# Patient Record
Sex: Male | Born: 1965 | Race: Black or African American | Hispanic: No | Marital: Married | Smoking: Never smoker
Health system: Southern US, Community
[De-identification: ages and names within clinical notes are randomized; demographics above are authoritative.]

## PROBLEM LIST (undated history)

## (undated) DIAGNOSIS — I10 Essential (primary) hypertension: Secondary | ICD-10-CM

## (undated) HISTORY — PX: HIP ARTHROPLASTY: SHX981

## (undated) HISTORY — PX: BACK SURGERY: SHX140

## (undated) HISTORY — PX: OPEN ANTERIOR SHOULDER RECONSTRUCTION: SHX2100

## (undated) HISTORY — PX: NECK SURGERY: SHX720

---

## 2019-11-12 ENCOUNTER — Ambulatory Visit (INDEPENDENT_AMBULATORY_CARE_PROVIDER_SITE_OTHER): Payer: 59 | Admitting: Orthopaedic Surgery

## 2019-11-12 ENCOUNTER — Other Ambulatory Visit: Payer: Self-pay

## 2019-11-12 ENCOUNTER — Ambulatory Visit: Payer: Self-pay

## 2019-11-12 VITALS — Ht 69.0 in | Wt 225.0 lb

## 2019-11-12 DIAGNOSIS — M25552 Pain in left hip: Secondary | ICD-10-CM | POA: Diagnosis not present

## 2019-11-12 DIAGNOSIS — M87052 Idiopathic aseptic necrosis of left femur: Secondary | ICD-10-CM | POA: Diagnosis not present

## 2019-11-12 NOTE — Progress Notes (Addendum)
Office Visit Note   Patient: Cody Carey           Date of Birth: 1966/06/03           MRN: [id number] Visit Date: 11/12/2019              Requested by: No referring provider defined for this encounter. PCP: Patient, No Pcp Per   Assessment & Plan: Visit Diagnoses:  1. Pain in left hip   2. Avascular necrosis of bone of left hip (Louise)     Plan: I went over the patient's x-rays with him in detail.  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  This is definitely osteonecrosis and not osteoarthritis..  I agree that he needs a hip replacement and this needs to be done soon given the degree of flattening of his femoral head and the pain he is having.  I described in detail and discussed anterior hip placement surgery.  I gave him a handout about this and we discussed in detail the risk and benefits of surgery.  We talked about his interoperative and postoperative course and what to expect.  All question concerns were answered and addressed.  We will work on getting this scheduled in the near future.  Of note, a WOMAC or HOOS score is not applicable in this situation given that the patient has end-stage avascular necrosis.  For AVN such as this it is more appropriate to use a Engineer, agricultural system.  Based on his radiographic assessment he is at stage VI, which is the highest stage showing the most severe deformity of a hip with avascular necrosis.  Follow-Up Instructions: Return for 2 weeks post-op.   Orders:  Orders Placed This Encounter  Procedures  . XR HIP UNILAT W OR W/O PELVIS 1V LEFT   No orders of the defined types were placed in this encounter.     Procedures: No procedures performed   Clinical Data: No additional findings.   Subjective: Chief Complaint  Patient presents with  . Left Hip - Pain  Patient is a very pleasant 54 year old gentleman who comes in for evaluation treatment of left hip pain and known  osteoarthritis of left hip.  He actually has a remote history of a right total hip arthroplasty that was done in [facility].  That surgeon has since retired.  His left hip pain is daily and it is 10 out of 10.  He hurts with pivoting activities.  It hurts in his groin on the left side.  Weightbearing activities cause severe pain in his left hip.  He has been told before that he needs hip replacement surgery.  He now saw me out to have this done on the left side through a direct anterior approach.  He denies being a diabetic.  He denies any other acute medical issues.  At this point his left hip pain has detrimentally affected his activities day living, his quality of life and his mobility.  He does wish to proceed with a left total hip arthroplasty.  He has been dealing with this pain for multiple years now.  He is treated this conservatively with hip strengthening exercises as well as anti-inflammatories and rest.  He has tried activity modification and weight loss.  He is now frustrated with the pain that he has been dealing with on a daily basis and it is getting worse.  He does walk using an assistive device.  His left  hip symptoms are causing a severe disability at this standpoint.  Weightbearing activities are incredibly painful.  HPI  Review of Systems He currently denies any headache, chest pain, shortness of breath, fever, chills, nausea, vomiting  Objective: Vital Signs: Ht 5\' 9"  (1.753 m)   Wt 225 lb (102.1 kg)   BMI 33.23 kg/m   Physical Exam He is alert and orient x3 and in no acute distress Ortho Exam His right hip that was replaced years ago has fluid and full range of motion with no pain at all.  The left hip has severe pain with any attempts of internal and external rotation.  There is no block to rotation but he has a lot of guarding with this rotating his left hip. Specialty Comments:  No specialty comments available.  Imaging: XR HIP UNILAT W OR W/O PELVIS 1V  LEFT  Result Date: 11/12/2019 A low AP pelvis and lateral left hip shows end-stage avascular necrosis of the left hip.  There is flattening of the femoral head and significant cystic changes all throughout the femoral head.  There is a right total hip arthroplasty that appears normal in a good position.    PMFS History: There are no problems to display for this patient.  No past medical history on file.  No family history on file.   Social History   Occupational History  . Not on file  Tobacco Use  . Smoking status: Not on file  Substance and Sexual Activity  . Alcohol use: Not on file  . Drug use: Not on file  . Sexual activity: Not on file

## 2019-11-18 ENCOUNTER — Other Ambulatory Visit: Payer: Self-pay | Admitting: Physician Assistant

## 2019-11-19 NOTE — Patient Instructions (Addendum)
DUE TO COVID-19 ONLY ONE VISITOR IS ALLOWED TO COME WITH YOU AND STAY IN THE WAITING ROOM ONLY DURING PRE OP AND PROCEDURE DAY OF SURGERY. THE 1 VISITOR MAY VISIT WITH YOU AFTER SURGERY IN YOUR PRIVATE ROOM DURING VISITING HOURS ONLY!  YOU NEED TO HAVE A COVID 19 TEST ON: 2/23/21_ @  3:00 pm   _, THIS TEST MUST BE DONE BEFORE SURGERY, COME  [address], [place] , [address].  [facility]) ONCE YOUR COVID TEST IS COMPLETED, PLEASE BEGIN THE QUARANTINE INSTRUCTIONS AS OUTLINED IN YOUR HANDOUT.                Rydge Texidor     Your procedure is scheduled on: 11/27/19   Report to [facility] Main  Entrance   Report to admitting at: 7:15 AM     Call this number if you have problems the morning of surgery [phone]    Remember:    BRUSH YOUR TEETH MORNING OF SURGERY AND RINSE YOUR MOUTH OUT, NO CHEWING GUM CANDY OR MINTS.     Take these medicines the morning of surgery with A SIP OF WATER: AMLODIPINE,GABAPENTIN                                 You may not have any metal on your body including hair pins and              piercings  Do not wear jewelry, lotions, powders or perfumes, deodorant             Men may shave face and neck.   Do not bring valuables to the hospital. [facility] IS NOT             RESPONSIBLE   FOR VALUABLES.  Contacts, dentures or bridgework may not be worn into surgery.  Leave suitcase in the car. After surgery it may be brought to your room.     Patients discharged the day of surgery will not be allowed to drive home. IF YOU ARE HAVING SURGERY AND GOING HOME THE SAME DAY, YOU MUST HAVE AN ADULT TO DRIVE YOU HOME AND BE WITH YOU FOR 24 HOURS. YOU MAY GO HOME BY TAXI OR UBER OR ORTHERWISE, BUT AN ADULT MUST ACCOMPANY YOU HOME AND STAY WITH YOU FOR 24 HOURS.  Name and phone number of your driver:  Special Instructions: N/A              Please read over the following fact sheets you were  given: _____________________________________________________________________             NO SOLID FOOD AFTER MIDNIGHT THE NIGHT PRIOR TO SURGERY. NOTHING BY MOUTH EXCEPT CLEAR LIQUIDS UNTIL: 6:45 AM . PLEASE FINISH ENSURE DRINK PER SURGEON ORDER  WHICH NEEDS TO BE COMPLETED AT: 6:45 AM .   CLEAR LIQUID DIET   Foods Allowed                                                                     Foods Excluded  Coffee and tea, regular and decaf  liquids that you cannot  Plain Jell-O any favor except red or purple                                           see through such as: Fruit ices (not with fruit pulp)                                     milk, soups, orange juice  Iced Popsicles                                    All solid food Carbonated beverages, regular and diet                                    Cranberry, grape and apple juices Sports drinks like Gatorade Lightly seasoned clear broth or consume(fat free) Sugar, honey syrup  Sample Menu Breakfast                                Lunch                                     Supper Cranberry juice                    Beef broth                            Chicken broth Jell-O                                     Grape juice                           Apple juice Coffee or tea                        Jell-O                                      Popsicle                                                Coffee or tea                        Coffee or tea  _____________________________________________________________________  [facility] - Preparing for Surgery Before surgery, you can play an important role.  Because skin is not sterile, your skin needs to be as free of germs as possible.  You can reduce the number of germs on your skin by washing with CHG (chlorahexidine gluconate) soap before surgery.  CHG is an antiseptic cleaner which kills  germs and bonds with the skin to continue killing germs even after  washing. Please DO NOT use if you have an allergy to CHG or antibacterial soaps.  If your skin becomes reddened/irritated stop using the CHG and inform your nurse when you arrive at Short Stay. Do not shave (including legs and underarms) for at least 48 hours prior to the first CHG shower.  You may shave your face/neck. Please follow these instructions carefully:  1.  Shower with CHG Soap the night before surgery and the  morning of Surgery.  2.  If you choose to wash your hair, wash your hair first as usual with your  normal  shampoo.  3.  After you shampoo, rinse your hair and body thoroughly to remove the  shampoo.                           4.  Use CHG as you would any other liquid soap.  You can apply chg directly  to the skin and wash                       Gently with a scrungie or clean washcloth.  5.  Apply the CHG Soap to your body ONLY FROM THE NECK DOWN.   Do not use on face/ open                           Wound or open sores. Avoid contact with eyes, ears mouth and genitals (private parts).                       Wash face,  Genitals (private parts) with your normal soap.             6.  Wash thoroughly, paying special attention to the area where your surgery  will be performed.  7.  Thoroughly rinse your body with warm water from the neck down.  8.  DO NOT shower/wash with your normal soap after using and rinsing off  the CHG Soap.                9.  Pat yourself dry with a clean towel.            10.  Wear clean pajamas.            11.  Place clean sheets on your bed the night of your first shower and do not  sleep with pets. Day of Surgery : Do not apply any lotions/deodorants the morning of surgery.  Please wear clean clothes to the hospital/surgery center.  FAILURE TO FOLLOW THESE INSTRUCTIONS MAY RESULT IN THE CANCELLATION OF YOUR SURGERY PATIENT SIGNATURE_________________________________  NURSE  SIGNATURE__________________________________  ________________________________________________________________________   Adam Phenix  An incentive spirometer is a tool that can help keep your lungs clear and active. This tool measures how well you are filling your lungs with each breath. Taking long deep breaths may help reverse or decrease the chance of developing breathing (pulmonary) problems (especially infection) following:  A long period of time when you are unable to move or be active. BEFORE THE PROCEDURE   If the spirometer includes an indicator to show your best effort, your nurse or respiratory therapist will set it to a desired goal.  If possible, sit up straight or lean slightly forward. Try not to slouch.  Hold the incentive spirometer in an  upright position. INSTRUCTIONS FOR USE  1. Sit on the edge of your bed if possible, or sit up as far as you can in bed or on a chair. 2. Hold the incentive spirometer in an upright position. 3. Breathe out normally. 4. Place the mouthpiece in your mouth and seal your lips tightly around it. 5. Breathe in slowly and as deeply as possible, raising the piston or the ball toward the top of the column. 6. Hold your breath for 3-5 seconds or for as long as possible. Allow the piston or ball to fall to the bottom of the column. 7. Remove the mouthpiece from your mouth and breathe out normally. 8. Rest for a few seconds and repeat Steps 1 through 7 at least 10 times every 1-2 hours when you are awake. Take your time and take a few normal breaths between deep breaths. 9. The spirometer may include an indicator to show your best effort. Use the indicator as a goal to work toward during each repetition. 10. After each set of 10 deep breaths, practice coughing to be sure your lungs are clear. If you have an incision (the cut made at the time of surgery), support your incision when coughing by placing a pillow or rolled up towels firmly  against it. Once you are able to get out of bed, walk around indoors and cough well. You may stop using the incentive spirometer when instructed by your caregiver.  RISKS AND COMPLICATIONS  Take your time so you do not get dizzy or light-headed.  If you are in pain, you may need to take or ask for pain medication before doing incentive spirometry. It is harder to take a deep breath if you are having pain. AFTER USE  Rest and breathe slowly and easily.  It can be helpful to keep track of a log of your progress. Your caregiver can provide you with a simple table to help with this. If you are using the spirometer at home, follow these instructions: [place] IF:   You are having difficultly using the spirometer.  You have trouble using the spirometer as often as instructed.  Your pain medication is not giving enough relief while using the spirometer.  You develop fever of 100.5 F (38.1 C) or higher. SEEK IMMEDIATE MEDICAL CARE IF:   You cough up bloody sputum that had not been present before.  You develop fever of 102 F (38.9 C) or greater.  You develop worsening pain at or near the incision site. MAKE SURE YOU:   Understand these instructions.  Will watch your condition.  Will get help right away if you are not doing well or get worse. Document Released: 01/28/2007 Document Revised: 12/10/2011 Document Reviewed: 03/31/2007 [facility] Patient Information 2014 [place], [place].   ________________________________________________________________________

## 2019-11-23 ENCOUNTER — Encounter (HOSPITAL_COMMUNITY): Payer: Self-pay

## 2019-11-23 ENCOUNTER — Other Ambulatory Visit: Payer: Self-pay

## 2019-11-23 ENCOUNTER — Encounter (HOSPITAL_COMMUNITY)
Admission: RE | Admit: 2019-11-23 | Discharge: 2019-11-23 | Disposition: A | Discharge status: Home / Self Care | Payer: 59 | Source: Ambulatory Visit | Attending: Orthopaedic Surgery | Admitting: Orthopaedic Surgery

## 2019-11-23 DIAGNOSIS — R9431 Abnormal electrocardiogram [ECG] [EKG]: Secondary | ICD-10-CM | POA: Insufficient documentation

## 2019-11-23 DIAGNOSIS — Z01812 Encounter for preprocedural laboratory examination: Secondary | ICD-10-CM | POA: Insufficient documentation

## 2019-11-23 DIAGNOSIS — Z01810 Encounter for preprocedural cardiovascular examination: Secondary | ICD-10-CM | POA: Diagnosis not present

## 2019-11-23 HISTORY — DX: Essential (primary) hypertension: I10

## 2019-11-23 LAB — BASIC METABOLIC PANEL
Anion gap: 12 (ref 5–15)
BUN: 10 mg/dL (ref 6–20)
CO2: 25 mmol/L (ref 22–32)
Calcium: 9.2 mg/dL (ref 8.9–10.3)
Chloride: 106 mmol/L (ref 98–111)
Creatinine, Ser: 0.84 mg/dL (ref 0.61–1.24)
GFR calc Af Amer: >60 mL/min (ref >60)
GFR calc non Af Amer: >60 mL/min (ref >60)
Glucose, Bld: 106 mg/dL — ABNORMAL HIGH (ref 70–99)
Potassium: 4.1 mmol/L (ref 3.5–5.1)
Sodium: 143 mmol/L (ref 135–145)

## 2019-11-23 LAB — CBC
HCT: 45.2 % (ref 39.0–52.0)
Hemoglobin: 14.4 g/dL (ref 13.0–17.0)
MCH: 28.7 pg (ref 26.0–34.0)
MCHC: 31.9 g/dL (ref 30.0–36.0)
MCV: 90.2 fL (ref 80.0–100.0)
Platelets: 331 10*3/uL (ref 150–400)
RBC: 5.01 MIL/uL (ref 4.22–5.81)
RDW: 14.4 % (ref 11.5–15.5)
WBC: 6.8 10*3/uL (ref 4.0–10.5)
nRBC: 0 % (ref 0.0–0.2)

## 2019-11-23 LAB — SURGICAL PCR SCREEN
MRSA, PCR: NEGATIVE
Staphylococcus aureus: NEGATIVE

## 2019-11-23 NOTE — Progress Notes (Signed)
PCP - Dr. Felipa Furnace A. LOV: 06/22/19 CE[facility] Cardiologist -   Chest x-ray -  EKG -  Stress Test -  ECHO -  Cardiac Cath -   Sleep Study -  CPAP -   Fasting Blood Sugar -  Checks Blood Sugar _____ times a day  Blood Thinner Instructions: Aspirin Instructions: Last Dose:  Anesthesia review: Pt. Denied that he smokes.  Patient denies shortness of breath, fever, cough and chest pain at PAT appointment   Patient verbalized understanding of instructions that were given to them at the PAT appointment. Patient was also instructed that they will need to review over the PAT instructions again at home before surgery.

## 2019-11-24 ENCOUNTER — Other Ambulatory Visit (HOSPITAL_COMMUNITY)
Admission: RE | Admit: 2019-11-24 | Discharge: 2019-11-24 | Disposition: A | Discharge status: Home / Self Care | Payer: 59 | Source: Ambulatory Visit | Attending: Orthopaedic Surgery | Admitting: Orthopaedic Surgery

## 2019-11-24 DIAGNOSIS — Z01812 Encounter for preprocedural laboratory examination: Secondary | ICD-10-CM | POA: Diagnosis present

## 2019-11-24 DIAGNOSIS — U071 COVID-19: Secondary | ICD-10-CM | POA: Insufficient documentation

## 2019-11-25 ENCOUNTER — Telehealth: Payer: Self-pay

## 2019-11-25 LAB — SARS CORONAVIRUS 2 (TAT 6-24 HRS): SARS Coronavirus 2: POSITIVE — AB

## 2019-11-25 NOTE — Progress Notes (Signed)
Notified: Message left on the Nurse triage line requested call back That patient's pre-procedure Covid test is +.  Patient can be rescheduled for procedure 10 days after + covid, if immunocompromised 20 days after + covid.  Patient will not require a repeat covid test if procedure rescheduled within next 90 days

## 2019-11-25 NOTE — Telephone Encounter (Signed)
Spoke with patient earlier this morning and rescheduled surgery 21 days out per [facility] guidelines.

## 2019-11-25 NOTE — Telephone Encounter (Signed)
Shanda Bumps Nurse from [facility] called and LVM on triage phone. States patient went for his pre-op appt yesterday and tested positive for Covid. He will need to cancel his SU scheduled for 11/27/2019. He will need to cancel for at least 10 days and will not require to retest. Please contact patient and relay information per [place].   If you need to call Shanda Bumps back her number is  [phone]

## 2019-11-27 ENCOUNTER — Other Ambulatory Visit: Payer: Self-pay

## 2019-12-10 NOTE — Patient Instructions (Addendum)
DUE TO COVID-19 ONLY ONE VISITOR IS ALLOWED TO COME WITH YOU AND STAY IN THE WAITING ROOM ONLY DURING PRE OP AND PROCEDURE DAY OF SURGERY. THE 1 VISITOR MAY VISIT WITH YOU AFTER SURGERY IN YOUR PRIVATE ROOM DURING VISITING HOURS ONLY!                 Cody Carey  12/10/2019   Your procedure is scheduled on: 12-18-19    Report to [facility] Main  Entrance    Report to Short Stay  at 5:30 AM     Call this number if you have problems the morning of surgery [phone]    Remember: NO SOLID FOOD AFTER MIDNIGHT THE NIGHT PRIOR TO SURGERY. NOTHING BY MOUTH EXCEPT CLEAR LIQUIDS UNTIL 4:15 AM . PLEASE FINISH ENSURE DRINK PER SURGEON ORDER  WHICH NEEDS TO BE COMPLETED AT 4:15 AM.   CLEAR LIQUID DIET   Foods Allowed                                                                     Foods Excluded  Coffee and tea, regular and decaf                             liquids that you cannot  Plain Jell-O any favor except red or purple                                           see through such as: Fruit ices (not with fruit pulp)                                     milk, soups, orange juice  Iced Popsicles                                    All solid food Carbonated beverages, regular and diet                                    Cranberry, grape and apple juices Sports drinks like Gatorade Lightly seasoned clear broth or consume(fat free) Sugar, honey syrup   _____________________________________________________________________     Take these medicines the morning of surgery with A SIP OF WATER: Amlodipine   BRUSH YOUR TEETH MORNING OF SURGERY AND RINSE YOUR MOUTH OUT, NO CHEWING GUM CANDY OR MINTS.                                You may not have any metal on your body including hair pins and              piercings     Do not wear jewelry, cologne, lotions, powders or deodorant  Men may shave face and neck.   Do not bring valuables to the hospital. [facility] IS NOT             RESPONSIBLE   FOR VALUABLES.  Contacts, dentures or bridgework may not be worn into surgery.  You may bring in overnight bag     Special Instructions: N/A              Please read over the following fact sheets you were given: _____________________________________________________________________             [facility] - Preparing for Surgery Before surgery, you can play an important role.  Because skin is not sterile, your skin needs to be as free of germs as possible.  You can reduce the number of germs on your skin by washing with CHG (chlorahexidine gluconate) soap before surgery.  CHG is an antiseptic cleaner which kills germs and bonds with the skin to continue killing germs even after washing. Please DO NOT use if you have an allergy to CHG or antibacterial soaps.  If your skin becomes reddened/irritated stop using the CHG and inform your nurse when you arrive at Short Stay. Do not shave (including legs and underarms) for at least 48 hours prior to the first CHG shower.  You may shave your face/neck. Please follow these instructions carefully:  1.  Shower with CHG Soap the night before surgery and the  morning of Surgery.  2.  If you choose to wash your hair, wash your hair first as usual with your  normal  shampoo.  3.  After you shampoo, rinse your hair and body thoroughly to remove the  shampoo.                           4.  Use CHG as you would any other liquid soap.  You can apply chg directly  to the skin and wash                       Gently with a scrungie or clean washcloth.  5.  Apply the CHG Soap to your body ONLY FROM THE NECK DOWN.   Do not use on face/ open                           Wound or open sores. Avoid contact with eyes, ears mouth and genitals (private parts).                       Wash face,  Genitals (private parts) with your normal soap.             6.  Wash thoroughly, paying special attention to the area where your surgery  will be  performed.  7.  Thoroughly rinse your body with warm water from the neck down.  8.  DO NOT shower/wash with your normal soap after using and rinsing off  the CHG Soap.                9.  Pat yourself dry with a clean towel.            10.  Wear clean pajamas.            11.  Place clean sheets on your bed the night of your first shower and do not  sleep with pets. Day of Surgery :  Do not apply any lotions/deodorants the morning of surgery.  Please wear clean clothes to the hospital/surgery center.  FAILURE TO FOLLOW THESE INSTRUCTIONS MAY RESULT IN THE CANCELLATION OF YOUR SURGERY PATIENT SIGNATURE_________________________________  NURSE SIGNATURE__________________________________  ________________________________________________________________________

## 2019-12-10 NOTE — Progress Notes (Signed)
PCP - Yvonne Kendall LOV 12-03-19 Cardiologist -   Chest x-ray -  EKG - 11-23-19 Stress Test -  ECHO -  Cardiac Cath -   Sleep Study -  CPAP -   Fasting Blood Sugar -  Checks Blood Sugar _____ times a day  Blood Thinner Instructions: Aspirin Instructions: Last Dose:  Anesthesia review:   Patient denies shortness of breath, fever, cough and chest pain at PAT appointment   Patient verbalized understanding of instructions that were given to them at the PAT appointment. Patient was also instructed that they will need to review over the PAT instructions again at home before surgery.

## 2019-12-11 ENCOUNTER — Encounter (HOSPITAL_COMMUNITY)
Admission: RE | Admit: 2019-12-11 | Discharge: 2019-12-11 | Disposition: A | Discharge status: Home / Self Care | Payer: 59 | Source: Ambulatory Visit | Attending: Orthopaedic Surgery | Admitting: Orthopaedic Surgery

## 2019-12-11 ENCOUNTER — Encounter (HOSPITAL_COMMUNITY): Payer: Self-pay

## 2019-12-11 ENCOUNTER — Other Ambulatory Visit: Payer: Self-pay

## 2019-12-11 DIAGNOSIS — Z01812 Encounter for preprocedural laboratory examination: Secondary | ICD-10-CM | POA: Insufficient documentation

## 2019-12-11 LAB — SURGICAL PCR SCREEN
MRSA, PCR: NEGATIVE
Staphylococcus aureus: NEGATIVE

## 2019-12-11 LAB — CBC
HCT: 42.9 % (ref 39.0–52.0)
Hemoglobin: 13.8 g/dL (ref 13.0–17.0)
MCH: 29.2 pg (ref 26.0–34.0)
MCHC: 32.2 g/dL (ref 30.0–36.0)
MCV: 90.9 fL (ref 80.0–100.0)
Platelets: 332 10*3/uL (ref 150–400)
RBC: 4.72 MIL/uL (ref 4.22–5.81)
RDW: 14.7 % (ref 11.5–15.5)
WBC: 7.9 10*3/uL (ref 4.0–10.5)
nRBC: 0 % (ref 0.0–0.2)

## 2019-12-11 LAB — BASIC METABOLIC PANEL
Anion gap: 13 (ref 5–15)
BUN: 15 mg/dL (ref 6–20)
CO2: 23 mmol/L (ref 22–32)
Calcium: 9.2 mg/dL (ref 8.9–10.3)
Chloride: 104 mmol/L (ref 98–111)
Creatinine, Ser: 0.98 mg/dL (ref 0.61–1.24)
GFR calc Af Amer: >60 mL/min (ref >60)
GFR calc non Af Amer: >60 mL/min (ref >60)
Glucose, Bld: 116 mg/dL — ABNORMAL HIGH (ref 70–99)
Potassium: 4 mmol/L (ref 3.5–5.1)
Sodium: 140 mmol/L (ref 135–145)

## 2019-12-14 ENCOUNTER — Inpatient Hospital Stay: Payer: 59 | Admitting: Orthopaedic Surgery

## 2019-12-17 ENCOUNTER — Encounter (HOSPITAL_COMMUNITY): Payer: Self-pay | Admitting: Orthopaedic Surgery

## 2019-12-17 DIAGNOSIS — M87052 Idiopathic aseptic necrosis of left femur: Secondary | ICD-10-CM

## 2019-12-17 HISTORY — DX: Idiopathic aseptic necrosis of left femur: M87.052

## 2019-12-17 NOTE — Anesthesia Preprocedure Evaluation (Addendum)
Anesthesia Evaluation  Patient identified by MRN, date of birth, ID band Patient awake    Reviewed: Allergy & Precautions, NPO status , Patient's Chart, lab work & pertinent test results  History of Anesthesia Complications Negative for: history of anesthetic complications  Airway Mallampati: II  TM Distance: >3 FB Neck ROM: Full    Dental   Pulmonary neg pulmonary ROS, Patient abstained from smoking.,    Pulmonary exam normal        Cardiovascular hypertension, Normal cardiovascular exam     Neuro/Psych negative neurological ROS  negative psych ROS   GI/Hepatic negative GI ROS, Neg liver ROS,   Endo/Other  negative endocrine ROS  Renal/GU negative Renal ROS  negative genitourinary   Musculoskeletal negative musculoskeletal ROS (+)   Abdominal   Peds  Hematology  (+) HIV,   Anesthesia Other Findings COVID-19 positive 11/24/19  Reproductive/Obstetrics                            Anesthesia Physical Anesthesia Plan  ASA: III  Anesthesia Plan: General   Post-op Pain Management:    Induction: Intravenous  PONV Risk Score and Plan: 2 and Ondansetron, Dexamethasone, Treatment may vary due to age or medical condition and Midazolam  Airway Management Planned: Oral ETT  Additional Equipment: None  Intra-op Plan:   Post-operative Plan: Extubation in OR  Informed Consent: I have reviewed the patients History and Physical, chart, labs and discussed the procedure including the risks, benefits and alternatives for the proposed anesthesia with the patient or authorized representative who has indicated his/her understanding and acceptance.     Dental advisory given  Plan Discussed with:   Anesthesia Plan Comments:         Anesthesia Quick Evaluation

## 2019-12-17 NOTE — H&P (Signed)
TOTAL HIP ADMISSION H&P  Patient is admitted for left total hip arthroplasty.  Subjective:  Chief Complaint: left hip pain  HPI: Cody Carey, 54 y.o. male, has a history of pain and functional disability in the left hip(s) due to avascular necrosis and patient has failed non-surgical conservative treatments for greater than 12 weeks to include NSAID's and/or analgesics, use of assistive devices and activity modification.  Onset of symptoms was abrupt starting 2 years ago with rapidlly worsening course since that time.The patient noted no past surgery on the left hip(s).  Patient currently rates pain in the left hip at 10 out of 10 with activity. Patient has night pain, worsening of pain with activity and weight bearing, trendelenberg gait, pain that interfers with activities of daily living and pain with passive range of motion. Patient has evidence of subchondral cysts and joint space narrowing by imaging studies. This condition presents safety issues increasing the risk of falls.  There is no current active infection.  Patient Active Problem List   Diagnosis Date Noted  . Avascular necrosis of hip, left (HCC) 12/17/2019   Past Medical History:  Diagnosis Date  . Avascular necrosis of hip, left (HCC) 12/17/2019  . Hypertension     Past Surgical History:  Procedure Laterality Date  . BACK SURGERY     Shave bone off, crushing into spine  . HIP ARTHROPLASTY Right   . NECK SURGERY    . OPEN ANTERIOR SHOULDER RECONSTRUCTION Bilateral     No current facility-administered medications for this encounter.   Current Outpatient Medications  Medication Sig Dispense Refill Last Dose  . amLODipine (NORVASC) 10 MG tablet Take 10 mg by mouth at bedtime.      Marland Kitchen BIKTARVY 50-200-25 MG TABS tablet Take 1 tablet by mouth at bedtime.      . diphenhydrAMINE (BENADRYL) 25 mg capsule Take 25 mg by mouth every 4 (four) hours as needed for allergies or sleep.      . diphenhydramine-acetaminophen (TYLENOL PM)  25-500 MG TABS tablet Take 1 tablet by mouth at bedtime as needed (sleep).     Marland Kitchen esomeprazole (NEXIUM) 40 MG capsule Take 40 mg by mouth at bedtime.      . gabapentin (NEURONTIN) 300 MG capsule Take 600 mg by mouth every evening.      Marland Kitchen tiZANidine (ZANAFLEX) 4 MG tablet Take 4 mg by mouth at bedtime.       Allergies  Allergen Reactions  . Lisinopril Other (See Comments) and Swelling    Pt reports affected his kidneys; made kidneys start shutting down Renal failure Pt reports affected his kidneys; made kidneys start shutting down Renal failure   . Other Rash and Swelling    Swollen Face-eyes,lips   . Shellfish-Derived Products Rash and Swelling    Swollen Face-eyes,lips    Social History   Tobacco Use  . Smoking status: Never Smoker  . Smokeless tobacco: Never Used  Substance Use Topics  . Alcohol use: Never    No family history on file.   Review of Systems  All other systems reviewed and are negative.   Objective:  Physical Exam  Constitutional: He is oriented to person, place, and time. He appears well-developed and well-nourished.  HENT:  Head: Normocephalic and atraumatic.  Eyes: Pupils are equal, round, and reactive to light. EOM are normal.  Cardiovascular: Normal rate and regular rhythm.  Respiratory: Effort normal and breath sounds normal.  GI: Soft. Bowel sounds are normal.  Musculoskeletal:  Cervical back: Normal range of motion and neck supple.     Left hip: Tenderness and bony tenderness present. Decreased range of motion. Decreased strength.  Neurological: He is alert and oriented to person, place, and time.  Skin: Skin is warm and dry.  Psychiatric: He has a normal mood and affect.    Vital signs in last 24 hours:    Labs:   Estimated body mass index is 34.85 kg/m as calculated from the following:   Height as of 12/11/19: 5\' 9"  (1.753 m).   Weight as of 12/11/19: 107 kg.   Imaging Review Plain radiographs demonstrate severe avascular  necrosis of the left hip(s). The bone quality appears to be good for age and reported activity level.      Assessment/Plan:  End stage avascular necrosis, left hip(s)  The patient history, physical examination, clinical judgement of the provider and imaging studies are consistent with end stage AVN of the left hip(s) and total hip arthroplasty is deemed medically necessary. The treatment options including medical management, injection therapy, arthroscopy and arthroplasty were discussed at length. The risks and benefits of total hip arthroplasty were presented and reviewed. The risks due to aseptic loosening, infection, stiffness, dislocation/subluxation,  thromboembolic complications and other imponderables were discussed.  The patient acknowledged the explanation, agreed to proceed with the plan and consent was signed. Patient is being admitted for inpatient treatment for surgery, pain control, PT, OT, prophylactic antibiotics, VTE prophylaxis, progressive ambulation and ADL's and discharge planning.The patient is planning to be discharged home with home health services

## 2019-12-18 ENCOUNTER — Ambulatory Visit (HOSPITAL_COMMUNITY): Payer: 59 | Admitting: Physician Assistant

## 2019-12-18 ENCOUNTER — Ambulatory Visit (HOSPITAL_COMMUNITY): Payer: 59

## 2019-12-18 ENCOUNTER — Observation Stay (HOSPITAL_COMMUNITY)
Admission: RE | Admit: 2019-12-18 | Discharge: 2019-12-19 | Disposition: A | Discharge status: Home Health | Payer: 59 | Attending: Orthopaedic Surgery | Admitting: Orthopaedic Surgery

## 2019-12-18 ENCOUNTER — Ambulatory Visit (HOSPITAL_COMMUNITY): Payer: 59 | Admitting: Certified Registered Nurse Anesthetist

## 2019-12-18 ENCOUNTER — Encounter (HOSPITAL_COMMUNITY): Payer: Self-pay | Admitting: Orthopaedic Surgery

## 2019-12-18 ENCOUNTER — Encounter (HOSPITAL_COMMUNITY)
Admission: RE | Disposition: A | Discharge status: Home Health | Payer: Self-pay | Source: Home / Self Care | Attending: Orthopaedic Surgery

## 2019-12-18 ENCOUNTER — Observation Stay (HOSPITAL_COMMUNITY): Payer: 59

## 2019-12-18 ENCOUNTER — Other Ambulatory Visit: Payer: Self-pay

## 2019-12-18 DIAGNOSIS — M87052 Idiopathic aseptic necrosis of left femur: Secondary | ICD-10-CM | POA: Diagnosis not present

## 2019-12-18 DIAGNOSIS — M87852 Other osteonecrosis, left femur: Principal | ICD-10-CM | POA: Insufficient documentation

## 2019-12-18 DIAGNOSIS — Z888 Allergy status to other drugs, medicaments and biological substances status: Secondary | ICD-10-CM | POA: Diagnosis not present

## 2019-12-18 DIAGNOSIS — Z96642 Presence of left artificial hip joint: Secondary | ICD-10-CM

## 2019-12-18 DIAGNOSIS — I10 Essential (primary) hypertension: Secondary | ICD-10-CM | POA: Insufficient documentation

## 2019-12-18 DIAGNOSIS — Z79899 Other long term (current) drug therapy: Secondary | ICD-10-CM | POA: Insufficient documentation

## 2019-12-18 DIAGNOSIS — Z419 Encounter for procedure for purposes other than remedying health state, unspecified: Secondary | ICD-10-CM

## 2019-12-18 HISTORY — PX: TOTAL HIP ARTHROPLASTY: SHX124

## 2019-12-18 SURGERY — ARTHROPLASTY, HIP, TOTAL, ANTERIOR APPROACH
Anesthesia: General | Site: Hip | Laterality: Left

## 2019-12-18 MED ORDER — PROPOFOL 1000 MG/100ML IV EMUL
INTRAVENOUS | Status: AC
  Filled 2019-12-18: qty 100

## 2019-12-18 MED ORDER — SODIUM CHLORIDE 0.9 % IV SOLN: INTRAVENOUS | Status: DC

## 2019-12-18 MED ORDER — METOCLOPRAMIDE HCL 5 MG PO TABS: 5.0000 mg | ORAL_TABLET | Freq: Three times a day (TID) | ORAL | Status: DC | PRN

## 2019-12-18 MED ORDER — MENTHOL 3 MG MT LOZG: 1.0000 | LOZENGE | OROMUCOSAL | Status: DC | PRN

## 2019-12-18 MED ORDER — FENTANYL CITRATE (PF) 100 MCG/2ML IJ SOLN
INTRAMUSCULAR | Status: DC | PRN
  Administered 2019-12-18: 100 ug via INTRAVENOUS
  Administered 2019-12-18: 50 ug via INTRAVENOUS
  Administered 2019-12-18: 100 ug via INTRAVENOUS
  Administered 2019-12-18 (×2): 50 ug via INTRAVENOUS

## 2019-12-18 MED ORDER — TRANEXAMIC ACID-NACL 1000-0.7 MG/100ML-% IV SOLN
INTRAVENOUS | Status: AC
  Filled 2019-12-18: qty 100

## 2019-12-18 MED ORDER — HYDROMORPHONE HCL 1 MG/ML IJ SOLN
0.5000 mg | INTRAMUSCULAR | Status: DC | PRN
  Administered 2019-12-18 – 2019-12-19 (×2): 1 mg via INTRAVENOUS
  Filled 2019-12-18 (×2): qty 1

## 2019-12-18 MED ORDER — ESOMEPRAZOLE MAGNESIUM 20 MG PO CPDR
20.0000 mg | DELAYED_RELEASE_CAPSULE | Freq: Every day | ORAL | Status: DC
  Administered 2019-12-18: 20 mg via ORAL
  Filled 2019-12-18 (×2): qty 1

## 2019-12-18 MED ORDER — LABETALOL HCL 5 MG/ML IV SOLN
INTRAVENOUS | Status: AC
  Filled 2019-12-18: qty 4

## 2019-12-18 MED ORDER — AMLODIPINE BESYLATE 10 MG PO TABS: 10.0000 mg | ORAL_TABLET | Freq: Every day | ORAL | Status: DC

## 2019-12-18 MED ORDER — ONDANSETRON HCL 4 MG/2ML IJ SOLN: 4.0000 mg | Freq: Four times a day (QID) | INTRAMUSCULAR | Status: DC | PRN

## 2019-12-18 MED ORDER — MIDAZOLAM HCL 5 MG/5ML IJ SOLN
INTRAMUSCULAR | Status: DC | PRN
  Administered 2019-12-18: 2 mg via INTRAVENOUS

## 2019-12-18 MED ORDER — PHENOL 1.4 % MT LIQD: 1.0000 | OROMUCOSAL | Status: DC | PRN

## 2019-12-18 MED ORDER — FENTANYL CITRATE (PF) 250 MCG/5ML IJ SOLN
INTRAMUSCULAR | Status: AC
  Filled 2019-12-18: qty 5

## 2019-12-18 MED ORDER — ALUM & MAG HYDROXIDE-SIMETH 200-200-20 MG/5ML PO SUSP
30.0000 mL | ORAL | Status: DC | PRN
  Administered 2019-12-18 (×2): 30 mL via ORAL
  Filled 2019-12-18 (×2): qty 30

## 2019-12-18 MED ORDER — CHLORHEXIDINE GLUCONATE 4 % EX LIQD: 60.0000 mL | Freq: Once | CUTANEOUS | Status: DC

## 2019-12-18 MED ORDER — DIPHENHYDRAMINE HCL 12.5 MG/5ML PO ELIX
12.5000 mg | ORAL_SOLUTION | ORAL | Status: DC | PRN
  Administered 2019-12-18: 25 mg via ORAL
  Filled 2019-12-18: qty 10

## 2019-12-18 MED ORDER — TRANEXAMIC ACID-NACL 1000-0.7 MG/100ML-% IV SOLN
1000.0000 mg | INTRAVENOUS | Status: AC
  Administered 2019-12-18: 07:00:00 1000 mg via INTRAVENOUS

## 2019-12-18 MED ORDER — OXYCODONE HCL 5 MG PO TABS
ORAL_TABLET | ORAL | Status: AC
  Filled 2019-12-18: qty 1

## 2019-12-18 MED ORDER — PROPOFOL 10 MG/ML IV BOLUS
INTRAVENOUS | Status: DC | PRN
  Administered 2019-12-18: 150 mg via INTRAVENOUS

## 2019-12-18 MED ORDER — HYDROMORPHONE HCL 1 MG/ML IJ SOLN
0.2500 mg | INTRAMUSCULAR | Status: DC | PRN
  Administered 2019-12-18: 0.25 mg via INTRAVENOUS
  Administered 2019-12-18: 0.5 mg via INTRAVENOUS
  Administered 2019-12-18: 0.25 mg via INTRAVENOUS
  Administered 2019-12-18: 0.5 mg via INTRAVENOUS

## 2019-12-18 MED ORDER — SODIUM CHLORIDE 0.9 % IR SOLN
Status: DC | PRN
  Administered 2019-12-18: 1000 mL

## 2019-12-18 MED ORDER — PANTOPRAZOLE SODIUM 40 MG PO TBEC
40.0000 mg | DELAYED_RELEASE_TABLET | Freq: Every day | ORAL | Status: DC
  Administered 2019-12-18: 40 mg via ORAL
  Filled 2019-12-18: qty 1

## 2019-12-18 MED ORDER — HYDROMORPHONE HCL 1 MG/ML IJ SOLN
INTRAMUSCULAR | Status: AC
  Administered 2019-12-18: 0.5 mg via INTRAVENOUS
  Filled 2019-12-18: qty 2

## 2019-12-18 MED ORDER — METHOCARBAMOL 500 MG PO TABS
500.0000 mg | ORAL_TABLET | Freq: Four times a day (QID) | ORAL | Status: DC | PRN
  Administered 2019-12-18 – 2019-12-19 (×3): 500 mg via ORAL
  Filled 2019-12-18 (×3): qty 1

## 2019-12-18 MED ORDER — PROPOFOL 10 MG/ML IV BOLUS
INTRAVENOUS | Status: AC
  Filled 2019-12-18: qty 20

## 2019-12-18 MED ORDER — 0.9 % SODIUM CHLORIDE (POUR BTL) OPTIME
TOPICAL | Status: DC | PRN
  Administered 2019-12-18: 08:00:00 1000 mL

## 2019-12-18 MED ORDER — LABETALOL HCL 5 MG/ML IV SOLN
10.0000 mg | INTRAVENOUS | Status: DC | PRN
  Administered 2019-12-18: 10 mg via INTRAVENOUS

## 2019-12-18 MED ORDER — HYDROMORPHONE HCL 2 MG/ML IJ SOLN
INTRAMUSCULAR | Status: AC
  Filled 2019-12-18: qty 1

## 2019-12-18 MED ORDER — KETAMINE HCL 10 MG/ML IJ SOLN
INTRAMUSCULAR | Status: DC | PRN
  Administered 2019-12-18: 40 mg via INTRAVENOUS

## 2019-12-18 MED ORDER — PHENYLEPHRINE HCL (PRESSORS) 10 MG/ML IV SOLN
INTRAVENOUS | Status: AC
  Filled 2019-12-18: qty 1

## 2019-12-18 MED ORDER — ASPIRIN 81 MG PO CHEW
81.0000 mg | CHEWABLE_TABLET | Freq: Two times a day (BID) | ORAL | Status: DC
  Administered 2019-12-18 – 2019-12-19 (×2): 81 mg via ORAL
  Filled 2019-12-18 (×2): qty 1

## 2019-12-18 MED ORDER — ONDANSETRON HCL 4 MG/2ML IJ SOLN
INTRAMUSCULAR | Status: AC
  Filled 2019-12-18: qty 2

## 2019-12-18 MED ORDER — ONDANSETRON HCL 4 MG/2ML IJ SOLN
INTRAMUSCULAR | Status: DC | PRN
  Administered 2019-12-18: 4 mg via INTRAVENOUS

## 2019-12-18 MED ORDER — ACETAMINOPHEN 325 MG PO TABS
325.0000 mg | ORAL_TABLET | Freq: Four times a day (QID) | ORAL | Status: DC | PRN
  Administered 2019-12-19: 650 mg via ORAL
  Filled 2019-12-18: qty 2

## 2019-12-18 MED ORDER — ROCURONIUM BROMIDE 10 MG/ML (PF) SYRINGE
PREFILLED_SYRINGE | INTRAVENOUS | Status: DC | PRN
  Administered 2019-12-18: 100 mg via INTRAVENOUS

## 2019-12-18 MED ORDER — CEFAZOLIN SODIUM-DEXTROSE 2-4 GM/100ML-% IV SOLN
2.0000 g | INTRAVENOUS | Status: AC
  Administered 2019-12-18: 2 g via INTRAVENOUS

## 2019-12-18 MED ORDER — OXYCODONE HCL 5 MG PO TABS
10.0000 mg | ORAL_TABLET | ORAL | Status: DC | PRN
  Administered 2019-12-18 – 2019-12-19 (×2): 15 mg via ORAL
  Administered 2019-12-19: 10 mg via ORAL
  Administered 2019-12-19: 15 mg via ORAL
  Filled 2019-12-18 (×2): qty 3
  Filled 2019-12-18 (×2): qty 2
  Filled 2019-12-18 (×2): qty 3

## 2019-12-18 MED ORDER — ONDANSETRON HCL 4 MG PO TABS: 4.0000 mg | ORAL_TABLET | Freq: Four times a day (QID) | ORAL | Status: DC | PRN

## 2019-12-18 MED ORDER — KETAMINE HCL 10 MG/ML IJ SOLN
INTRAMUSCULAR | Status: AC
  Filled 2019-12-18: qty 1

## 2019-12-18 MED ORDER — ONDANSETRON HCL 4 MG/2ML IJ SOLN: 4.0000 mg | Freq: Once | INTRAMUSCULAR | Status: DC | PRN

## 2019-12-18 MED ORDER — SUGAMMADEX SODIUM 500 MG/5ML IV SOLN
INTRAVENOUS | Status: AC
  Filled 2019-12-18: qty 5

## 2019-12-18 MED ORDER — STERILE WATER FOR IRRIGATION IR SOLN
Status: DC | PRN
  Administered 2019-12-18: 2000 mL

## 2019-12-18 MED ORDER — CEFAZOLIN SODIUM-DEXTROSE 1-4 GM/50ML-% IV SOLN
1.0000 g | Freq: Four times a day (QID) | INTRAVENOUS | Status: AC
  Administered 2019-12-18 (×2): 1 g via INTRAVENOUS
  Filled 2019-12-18 (×2): qty 50

## 2019-12-18 MED ORDER — METHOCARBAMOL 500 MG IVPB - SIMPLE MED
500.0000 mg | Freq: Four times a day (QID) | INTRAVENOUS | Status: DC | PRN
  Administered 2019-12-18: 500 mg via INTRAVENOUS
  Filled 2019-12-18: qty 50

## 2019-12-18 MED ORDER — LACTATED RINGERS IV SOLN: INTRAVENOUS | Status: DC

## 2019-12-18 MED ORDER — LIDOCAINE 2% (20 MG/ML) 5 ML SYRINGE
INTRAMUSCULAR | Status: DC | PRN
  Administered 2019-12-18: 100 mg via INTRAVENOUS

## 2019-12-18 MED ORDER — DOCUSATE SODIUM 100 MG PO CAPS
100.0000 mg | ORAL_CAPSULE | Freq: Two times a day (BID) | ORAL | Status: DC
  Administered 2019-12-18 – 2019-12-19 (×2): 100 mg via ORAL
  Filled 2019-12-18 (×2): qty 1

## 2019-12-18 MED ORDER — OXYCODONE HCL 5 MG PO TABS
5.0000 mg | ORAL_TABLET | ORAL | Status: DC | PRN
  Administered 2019-12-18: 10 mg via ORAL
  Administered 2019-12-19: 5 mg via ORAL
  Filled 2019-12-18 (×2): qty 1
  Filled 2019-12-18: qty 2

## 2019-12-18 MED ORDER — MIDAZOLAM HCL 2 MG/2ML IJ SOLN
INTRAMUSCULAR | Status: AC
  Filled 2019-12-18: qty 2

## 2019-12-18 MED ORDER — GABAPENTIN 300 MG PO CAPS
600.0000 mg | ORAL_CAPSULE | Freq: Every evening | ORAL | Status: DC
  Administered 2019-12-18: 600 mg via ORAL
  Filled 2019-12-18: qty 2

## 2019-12-18 MED ORDER — BICTEGRAVIR-EMTRICITAB-TENOFOV 50-200-25 MG PO TABS
1.0000 | ORAL_TABLET | Freq: Every day | ORAL | Status: DC
  Administered 2019-12-18: 1 via ORAL
  Filled 2019-12-18 (×2): qty 1

## 2019-12-18 MED ORDER — POVIDONE-IODINE 10 % EX SWAB
2.0000 "application " | Freq: Once | CUTANEOUS | Status: AC
  Administered 2019-12-18: 2 via TOPICAL

## 2019-12-18 MED ORDER — METOCLOPRAMIDE HCL 5 MG/ML IJ SOLN: 5.0000 mg | Freq: Three times a day (TID) | INTRAMUSCULAR | Status: DC | PRN

## 2019-12-18 MED ORDER — POLYETHYLENE GLYCOL 3350 17 G PO PACK: 17.0000 g | PACK | Freq: Every day | ORAL | Status: DC | PRN

## 2019-12-18 MED ORDER — OXYCODONE HCL 5 MG/5ML PO SOLN: 5.0000 mg | Freq: Once | ORAL | Status: AC | PRN

## 2019-12-18 MED ORDER — SUGAMMADEX SODIUM 200 MG/2ML IV SOLN
INTRAVENOUS | Status: DC | PRN
  Administered 2019-12-18: 500 mg via INTRAVENOUS

## 2019-12-18 MED ORDER — OXYCODONE HCL 5 MG PO TABS
5.0000 mg | ORAL_TABLET | Freq: Once | ORAL | Status: AC | PRN
  Administered 2019-12-18: 5 mg via ORAL

## 2019-12-18 MED ORDER — DEXAMETHASONE SODIUM PHOSPHATE 10 MG/ML IJ SOLN
INTRAMUSCULAR | Status: DC | PRN
  Administered 2019-12-18: 10 mg via INTRAVENOUS

## 2019-12-18 MED ORDER — METHOCARBAMOL 500 MG IVPB - SIMPLE MED
INTRAVENOUS | Status: AC
  Filled 2019-12-18: qty 50

## 2019-12-18 MED ORDER — HYDROMORPHONE HCL 1 MG/ML IJ SOLN
INTRAMUSCULAR | Status: DC | PRN
  Administered 2019-12-18 (×2): 1 mg via INTRAVENOUS

## 2019-12-18 MED ORDER — CEFAZOLIN SODIUM-DEXTROSE 2-4 GM/100ML-% IV SOLN
INTRAVENOUS | Status: AC
  Filled 2019-12-18: qty 100

## 2019-12-18 MED ORDER — FENTANYL CITRATE (PF) 100 MCG/2ML IJ SOLN
INTRAMUSCULAR | Status: AC
  Filled 2019-12-18: qty 2

## 2019-12-18 SURGICAL SUPPLY — 46 items
ACETAB CUP W GRIPTION 54MM (Plate) ×1 IMPLANT
ACETAB CUP W/GRIPTION 54 (Plate) ×2 IMPLANT
ARTICULEZE HEAD 36 12 (Hips) ×2 IMPLANT
ARTICULEZE HEAD 36MM 12 (Hips) ×1 IMPLANT
BAG ZIPLOCK 12X15 (MISCELLANEOUS) IMPLANT
BALL HIP ARTICU EZE 36 8.5 (Hips) IMPLANT
BENZOIN TINCTURE PRP APPL 2/3 (GAUZE/BANDAGES/DRESSINGS) IMPLANT
BLADE SAW SGTL 18X1.27X75 (BLADE) ×2 IMPLANT
BLADE SAW SGTL 18X1.27X75MM (BLADE) ×1
CLOSURE WOUND 1/2 X4 (GAUZE/BANDAGES/DRESSINGS)
COVER PERINEAL POST (MISCELLANEOUS) ×3 IMPLANT
COVER SURGICAL LIGHT HANDLE (MISCELLANEOUS) ×3 IMPLANT
COVER WAND RF STERILE (DRAPES) ×3 IMPLANT
CUP ACETAB W/GRIPTION 54 (Plate) IMPLANT
DRAPE STERI IOBAN 125X83 (DRAPES) ×3 IMPLANT
DRAPE U-SHAPE 47X51 STRL (DRAPES) ×6 IMPLANT
DRSG AQUACEL AG ADV 3.5X10 (GAUZE/BANDAGES/DRESSINGS) ×3 IMPLANT
DURAPREP 26ML APPLICATOR (WOUND CARE) ×3 IMPLANT
ELECT REM PT RETURN 15FT ADLT (MISCELLANEOUS) ×3 IMPLANT
GAUZE XEROFORM 1X8 LF (GAUZE/BANDAGES/DRESSINGS) ×3 IMPLANT
GLOVE BIO SURGEON STRL SZ7.5 (GLOVE) ×3 IMPLANT
GLOVE BIOGEL PI IND STRL 8 (GLOVE) ×2 IMPLANT
GLOVE BIOGEL PI INDICATOR 8 (GLOVE) ×4
GLOVE ECLIPSE 8.0 STRL XLNG CF (GLOVE) ×3 IMPLANT
GOWN STRL REUS W/TWL XL LVL3 (GOWN DISPOSABLE) ×6 IMPLANT
HANDPIECE INTERPULSE COAX TIP (DISPOSABLE) ×2
HEAD ARTICULEZE 36 12 (Hips) IMPLANT
HIP BALL ARTICU EZE 36 8.5 (Hips) ×3 IMPLANT
HOLDER FOLEY CATH W/STRAP (MISCELLANEOUS) ×1 IMPLANT
KIT TURNOVER KIT A (KITS) IMPLANT
LINER NEUTRAL 54X36MM PLUS 4 (Hips) ×2 IMPLANT
PACK ANTERIOR HIP CUSTOM (KITS) ×3 IMPLANT
PENCIL SMOKE EVACUATOR (MISCELLANEOUS) ×2 IMPLANT
SET HNDPC FAN SPRY TIP SCT (DISPOSABLE) ×1 IMPLANT
STAPLER VISISTAT 35W (STAPLE) ×2 IMPLANT
STEM FEMORAL SZ5 HIGH ACTIS (Stem) ×2 IMPLANT
STRIP CLOSURE SKIN 1/2X4 (GAUZE/BANDAGES/DRESSINGS) IMPLANT
SUT ETHIBOND NAB CT1 #1 30IN (SUTURE) ×3 IMPLANT
SUT ETHILON 2 0 PS N (SUTURE) IMPLANT
SUT MNCRL AB 4-0 PS2 18 (SUTURE) IMPLANT
SUT VIC AB 0 CT1 36 (SUTURE) ×3 IMPLANT
SUT VIC AB 1 CT1 36 (SUTURE) ×3 IMPLANT
SUT VIC AB 2-0 CT1 27 (SUTURE) ×4
SUT VIC AB 2-0 CT1 TAPERPNT 27 (SUTURE) ×2 IMPLANT
TRAY FOLEY MTR SLVR 16FR STAT (SET/KITS/TRAYS/PACK) IMPLANT
YANKAUER SUCT BULB TIP 10FT TU (MISCELLANEOUS) ×3 IMPLANT

## 2019-12-18 NOTE — Transfer of Care (Signed)
Immediate Anesthesia Transfer of Care Note  Patient: Cody Carey  Procedure(s) Performed: LEFT TOTAL HIP ARTHROPLASTY ANTERIOR APPROACH (Left Hip)  Patient Location: PACU  Anesthesia Type:General  Level of Consciousness: awake, alert  and oriented  Airway & Oxygen Therapy: Patient Spontanous Breathing and Patient connected to face mask oxygen  Post-op Assessment: Report given to RN and Post -op Vital signs reviewed and stable  Post vital signs: Reviewed and stable  Last Vitals:  Vitals Value Taken Time  BP    Temp    Pulse 103 12/18/19 0849  Resp 14 12/18/19 0849  SpO2 100 % 12/18/19 0849  Vitals shown include unvalidated device data.  Last Pain:  Vitals:   12/18/19 0550  TempSrc:   PainSc: 8       Patients Stated Pain Goal: 7 (12/18/19 0550)  Complications: No apparent anesthesia complications

## 2019-12-18 NOTE — Anesthesia Postprocedure Evaluation (Signed)
Anesthesia Post Note  Patient: Cordaryl Decelles  Procedure(s) Performed: LEFT TOTAL HIP ARTHROPLASTY ANTERIOR APPROACH (Left Hip)     Patient location during evaluation: PACU Level of consciousness: awake and alert Pain management: satisfactory to patient Vital Signs Assessment: post-procedure vital signs reviewed and stable Respiratory status: spontaneous breathing, nonlabored ventilation and respiratory function stable Cardiovascular status: blood pressure returned to baseline and stable Postop Assessment: no apparent nausea or vomiting Anesthetic complications: no    Last Vitals:  Vitals:   12/18/19 1335 12/18/19 1416  BP: (!) 161/95 (!) 135/97  Pulse: 84 88  Resp: 16 16  Temp:  36.6 C  SpO2: 100% 99%    Last Pain:  Vitals:   12/18/19 1420  TempSrc:   PainSc: 8                  Brekyn Huntoon E Alexine Pilant

## 2019-12-18 NOTE — H&P (Signed)
The patient understands that we are proceeding to surgery today for a left total hip arthroplasty.  There has been no acute changes in medical status.  See previous H&P.  The risk and benefits of surgery have been discussed in detail and informed consent is obtained.  The left hip is been marked.

## 2019-12-18 NOTE — Anesthesia Procedure Notes (Signed)
Procedure Name: Intubation Date/Time: 12/18/2019 7:17 AM Performed by: Maxwell Caul, CRNA Pre-anesthesia Checklist: Patient identified, Emergency Drugs available, Suction available and Patient being monitored Patient Re-evaluated:Patient Re-evaluated prior to induction Oxygen Delivery Method: Circle system utilized Preoxygenation: Pre-oxygenation with 100% oxygen Induction Type: IV induction Ventilation: Mask ventilation without difficulty Laryngoscope Size: Mac and 4 Grade View: Grade I Tube type: Oral Tube size: 7.5 mm Number of attempts: 1 Airway Equipment and Method: Stylet Placement Confirmation: ETT inserted through vocal cords under direct vision,  positive ETCO2 and breath sounds checked- equal and bilateral Secured at: 21 cm Tube secured with: Tape Dental Injury: Teeth and Oropharynx as per pre-operative assessment

## 2019-12-18 NOTE — Evaluation (Signed)
Physical Therapy Evaluation Patient Details Name: Cody Carey MRN: [id number] DOB: 12/09/1965 Today's Date: 12/18/2019   History of Present Illness  s/p L DA THA 2* AVN. PMH: R DA THA, bil anterior shoulder reconstruction  Clinical Impression  Pt is s/p THA resulting in the deficits listed below (see PT Problem List).  Pt agreeable to EOB/standing with max encouragement. Extremely lethargic and falling asleep repeatedly during session. Wife present for PT eval. Discussed progression and PT role with pt wife. Pt sleeping soundly in bed upon PT departure.  Pt will benefit from skilled PT to increase their independence and safety with mobility to allow discharge to the venue listed below.      Follow Up Recommendations Follow surgeon's recommendation for DC plan and follow-up therapies    Equipment Recommendations  None recommended by PT    Recommendations for Other Services       Precautions / Restrictions Precautions Precautions: Fall Restrictions Weight Bearing Restrictions: No      Mobility  Bed Mobility Overal bed mobility: Needs Assistance Bed Mobility: Supine to Sit;Sit to Supine     Supine to sit: Min assist Sit to supine: Min assist   General bed mobility comments: incr time, assist for LLE, cues for technique and self assist  Transfers Overall transfer level: Needs assistance Equipment used: Rolling walker (2 wheeled) Transfers: Sit to/from Stand Sit to Stand: Min assist;Min guard         General transfer comment: cues for hand placement  Ambulation/Gait Ambulation/Gait assistance: Min guard Gait Distance (Feet): 3 Feet Assistive device: Rolling walker (2 wheeled)       General Gait Details: lateral steps along EOB  Stairs            Wheelchair Mobility    Modified Rankin (Stroke Patients Only)       Balance                                             Pertinent Vitals/Pain Pain Assessment: 0-10 Pain Score: 4   Pain Descriptors / Indicators: Grimacing;Guarding Pain Intervention(s): Limited activity within patient's tolerance;Monitored during session;Premedicated before session;Repositioned    Home Living Family/patient expects to be discharged to:: Private residence Living Arrangements: Other relatives;Spouse/significant other   Type of Home: House Home Access: Stairs to enter   [employer] of Steps: 2 Home Layout: One level Home Equipment: Environmental consultant - 2 wheels      Prior Function Level of Independence: Independent               Hand Dominance        Extremity/Trunk Assessment   Upper Extremity Assessment Upper Extremity Assessment: Overall WFL for tasks assessed    Lower Extremity Assessment Lower Extremity Assessment: RLE deficits/detail RLE Deficits / Details: ankle WFL, knee/hip grossly 2+/5, limited by pain       Communication      Cognition Arousal/Alertness: Awake/alert Behavior During Therapy: WFL for tasks assessed/performed Overall Cognitive Status: Within Functional Limits for tasks assessed                                        General Comments      Exercises Total Joint Exercises Ankle Circles/Pumps: Limitations(too groggy) Ankle Circles/Pumps Limitations: falling asleep   Assessment/Plan    PT  Assessment Patient needs continued PT services  PT Problem List Decreased strength;Decreased mobility;Decreased range of motion;Decreased safety awareness;Decreased activity tolerance;Decreased balance;Pain;Decreased knowledge of use of DME       PT Treatment Interventions DME instruction;Gait training;Therapeutic exercise;Functional mobility training;Therapeutic activities;Patient/family education;Stair training    PT Goals (Current goals can be found in the Care Plan section)  Acute Rehab PT Goals Patient Stated Goal: less pain PT Goal Formulation: With patient Time For Goal Achievement: 12/25/19 Potential to Achieve  Goals: Good    Frequency 7X/week   Barriers to discharge        Co-evaluation               AM-PAC PT "6 Clicks" Mobility  Outcome Measure Help needed turning from your back to your side while in a flat bed without using bedrails?: A Little Help needed moving from lying on your back to sitting on the side of a flat bed without using bedrails?: A Little Help needed moving to and from a bed to a chair (including a wheelchair)?: A Little Help needed standing up from a chair using your arms (e.g., wheelchair or bedside chair)?: A Little Help needed to walk in hospital room?: A Lot Help needed climbing 3-5 steps with a railing? : A Lot 6 Click Score: 16    End of Session Equipment Utilized During Treatment: Gait belt Activity Tolerance: Patient limited by lethargy Patient left: in bed;with call bell/phone within reach;with bed alarm set Nurse Communication: Mobility status PT Visit Diagnosis: Difficulty in walking, not elsewhere classified (R26.2)    Time: 1430-1456 PT Time Calculation (min) (ACUTE ONLY): 26 min   Charges:   PT Evaluation $PT Eval Low Complexity: 1 Low PT Treatments $Therapeutic Activity: 8-22 mins        Delice Bison, PT   Acute Rehab Dept [facility]): [phone]   12/18/2019   [facility] 12/18/2019, 3:09 PM

## 2019-12-18 NOTE — Progress Notes (Signed)
Pt requesting Nexium instead of pantoprazole. MD is paged and new orders received.

## 2019-12-18 NOTE — Op Note (Signed)
NAMETYRION, GLAUDE MEDICAL RECORD [id number] ACCOUNT [id number] DATE OF BIRTH:[birth year] FACILITY: [facility] LOCATION: [facility] PHYSICIAN:Trask Vosler Aretha Parrot, MD  OPERATIVE REPORT  DATE OF PROCEDURE:  12/18/2019  PREOPERATIVE DIAGNOSIS:  End-stage avascular necrosis, left hip.  POSTOPERATIVE DIAGNOSIS:  End-stage avascular necrosis, left hip.  PROCEDURE:  Left total hip arthroplasty through direct anterior approach.  IMPLANTS:  DePuy Sector Gription acetabular component size 54, size 36+4 neutral polyethylene liner, size 5 Actis femoral component with high offset, size 36+12 metal hip ball.  SURGEON:  Vanita Panda. Magnus Ivan, MD  ASSISTANT:  Richardean Canal, PA-C  ANESTHESIA:  General.  ANTIBIOTICS:  Two grams IV Ancef.  ESTIMATED BLOOD LOSS:  100 mL.  COMPLICATIONS:  None.  INDICATIONS:  The patient is a 54 year old gentleman with a history of bilateral hip avascular necrosis.  He had his right hip replaced in [place] years ago.  He has been dealing with worsening deformity of his left hip and when we saw  him in the office earlier this year, basically, his left hip is almost subluxating.  He has lived with avascular necrosis for a while.  He has a significant Trendelenburg gait as well.  He also has an odd leg length discrepancy with his left side being  so stiff and externally rotated, but also slightly longer than his other side.  He understands this can be quite a difficult case in terms of getting him balanced and tightened.  He does wish to proceed with a total hip arthroplasty given the severity of  his deformity and pain and I agree with this based on the x-ray findings as well.  We did talk about the risk of acute blood loss anemia, nerve or vessel injury, fracture, infection, dislocation, DVT and implant failure.  We talked about our goals being  decreased pain, improve mobility and overall improved quality of life.  DESCRIPTION OF PROCEDURE:  After  informed consent was obtained and appropriate left hip was marked.  He was brought to the operating room and general anesthesia was obtained while he was on a stretcher.  We placed traction boots on both his feet.  Next,  he was placed supine on the Hana fracture table, the perineal post in place and both legs in line skeletal traction device and no traction applied.  His left operative hip was prepped and draped with DuraPrep and sterile drapes.  A time-out was called.   He was identified, correct patient, correct left hip.  I then made an incision just inferior and posterior to the anterior iliac spine and carried this obliquely down the leg.  We dissected down tensor fascia lata muscle.  Tensor fascia was then divided  longitudinally to proceed with direct anterior approach to the hip.  We identified and cauterized circumflex vessels and identified the hip capsule, opened up the hip capsule in an L-type format, finding moderate joint effusion and significant disease of  his left hip.  We placed Cobra retractors around the medial and lateral femoral neck and made our femoral neck cut low based on him having such a deformity.  We made this with an oscillating saw and completed it with an osteotome.  We placed a corkscrew  guide in the femoral head and removed the femoral head in its entirety.  It was completely devoid of cartilage and disintegrating away.  We removed large periarticular osteophytes from all around the acetabulum and loose bodies that were all in the hip  joint and the soft tissues.  Next,  we placed a bent Hohmann over the medial acetabular rim and removed remnants of acetabular labrum and other debris from the acetabulum.  I then began reaming with just a few reamers from a size 44 going up to a size 53  with all reamers placed under direct visualization and the last reamer also under direct fluoroscopy, so we could obtain our depth of reaming, our inclination and anteversion.  I then placed  the real DePuy Sector Gription acetabular component size 54 and  we went with a 36+4 neutral polyethylene liner knowing that he is in a more medial position than he was living for a long period of time.  Attention was then turned to the femur.  With the leg externally rotated to 120 degrees, extended and adducted, we  were placing Mueller retractor medially and Hohman retractor behind the greater trochanter.  We released lateral joint capsule and used a box-cutting osteotome to enter the femoral canal and a rongeur to lateralize.  I then began broaching using the  Actis broaching system from a size 0 to a size 5.  With a size 5 in place, we tried a high offset femoral neck and a 36+1.5 hip ball, reduced this in the acetabulum and we knew we needed to go several ball sizes higher to get him stable.  We removed the  trial components and then we went with the real high offset femoral component, size 5 and I went with an 8.5 metal hip ball, reduced this in the acetabulum, and I was just not pleased with stability of this.  I dislocated the hip and went up to a size 12  trial hip ball, reduced this in the acetabulum and that definitely offered more offset and stability on my exam assessing it mechanically and radiographically.  We dislocated that hip and placed the real 36+12 metal hip ball and again reduced this in  the acetabulum and on clinical exam and radiographic exam, it did feel like he was stable.  We then irrigated the soft tissue with normal saline solution using pulsatile lavage.  We were able to reapproximate the joint capsule using #1 Ethibond suture,  followed by closing the tensor fascia with #1 Vicryl.  The deep tissue was closed with 0 Vicryl and the subcutaneous tissue was closed with 2-0 Vicryl.  The skin was reapproximated with staples.  An Aquacel dressing was applied.  He was taken off the  Hana table, extubated, and taken to recovery room in stable condition.  All final counts were  correct.  There were no complications noted.  Of note, Benita Stabile, PA-C, assisted during the entire case and his assistance was crucial for facilitating all  aspects of this case.  CN/NUANCE  D:12/18/2019 T:12/18/2019 JOB:[id number]

## 2019-12-18 NOTE — Brief Op Note (Signed)
12/18/2019  8:26 AM  PATIENT:  Cody Carey  54 y.o. male  PRE-OPERATIVE DIAGNOSIS:  Avascular necrosis left hip  POST-OPERATIVE DIAGNOSIS:  Avascular necrosis left hip  PROCEDURE:  Procedure(s): LEFT TOTAL HIP ARTHROPLASTY ANTERIOR APPROACH (Left)  SURGEON:  Surgeon(s) and Role:    Kathryne Hitch, MD - Primary  PHYSICIAN ASSISTANT: Rexene Edison, PA-C  ANESTHESIA:   general  EBL:  100 mL   COUNTS:  YES  DICTATION: .Other Dictation: Dictation Number [id number]  PLAN OF CARE: Admit for overnight observation  PATIENT DISPOSITION:  PACU - hemodynamically stable.   Delay start of Pharmacological VTE agent (>24hrs) due to surgical blood loss or risk of bleeding: no

## 2019-12-19 DIAGNOSIS — M87852 Other osteonecrosis, left femur: Secondary | ICD-10-CM | POA: Diagnosis not present

## 2019-12-19 LAB — BASIC METABOLIC PANEL
Anion gap: 11 (ref 5–15)
BUN: 20 mg/dL (ref 6–20)
CO2: 26 mmol/L (ref 22–32)
Calcium: 9.1 mg/dL (ref 8.9–10.3)
Chloride: 98 mmol/L (ref 98–111)
Creatinine, Ser: 1.09 mg/dL (ref 0.61–1.24)
GFR calc Af Amer: >60 mL/min (ref >60)
GFR calc non Af Amer: >60 mL/min (ref >60)
Glucose, Bld: 142 mg/dL — ABNORMAL HIGH (ref 70–99)
Potassium: 4.3 mmol/L (ref 3.5–5.1)
Sodium: 135 mmol/L (ref 135–145)

## 2019-12-19 LAB — CBC
HCT: 38.5 % — ABNORMAL LOW (ref 39.0–52.0)
Hemoglobin: 12.3 g/dL — ABNORMAL LOW (ref 13.0–17.0)
MCH: 29.3 pg (ref 26.0–34.0)
MCHC: 31.9 g/dL (ref 30.0–36.0)
MCV: 91.7 fL (ref 80.0–100.0)
Platelets: 315 10*3/uL (ref 150–400)
RBC: 4.2 MIL/uL — ABNORMAL LOW (ref 4.22–5.81)
RDW: 14.5 % (ref 11.5–15.5)
WBC: 12.9 10*3/uL — ABNORMAL HIGH (ref 4.0–10.5)
nRBC: 0.3 % — ABNORMAL HIGH (ref 0.0–0.2)

## 2019-12-19 MED ORDER — OXYCODONE HCL 5 MG PO TABS: 5.0000 mg | ORAL_TABLET | ORAL | 0 refills | Status: DC | PRN

## 2019-12-19 MED ORDER — ASPIRIN 81 MG PO CHEW: 81.0000 mg | CHEWABLE_TABLET | Freq: Two times a day (BID) | ORAL | 0 refills | Status: AC

## 2019-12-19 MED ORDER — TIZANIDINE HCL 4 MG PO TABS: 4.0000 mg | ORAL_TABLET | Freq: Three times a day (TID) | ORAL | 1 refills | Status: AC | PRN

## 2019-12-19 NOTE — Discharge Instructions (Signed)

## 2019-12-19 NOTE — Discharge Summary (Signed)
Patient ID: Cody Carey MRN: [id number] DOB/AGE: 01/18/1966 54 y.o.  Admit date: 12/18/2019 Discharge date: 12/19/2019  Admission Diagnoses:  Principal Problem:   Avascular necrosis of hip, left (HCC) Active Problems:   Status post total replacement of left hip   Discharge Diagnoses:  Same  Past Medical History:  Diagnosis Date  . Avascular necrosis of hip, left (HCC) 12/17/2019  . Hypertension     Surgeries: Procedure(s): LEFT TOTAL HIP ARTHROPLASTY ANTERIOR APPROACH on 12/18/2019   Consultants:   Discharged Condition: Improved  Hospital Course: Cody Carey is an 54 y.o. male who was admitted 12/18/2019 for operative treatment ofAvascular necrosis of hip, left (HCC). Patient has severe unremitting pain that affects sleep, daily activities, and work/hobbies. After pre-op clearance the patient was taken to the operating room on 12/18/2019 and underwent  Procedure(s): LEFT TOTAL HIP ARTHROPLASTY ANTERIOR APPROACH.    Patient was given perioperative antibiotics:  Anti-infectives (From admission, onward)   Start     Dose/Rate Route Frequency Ordered Stop   12/18/19 2200  bictegravir-emtricitabine-tenofovir AF (BIKTARVY) 50-200-25 MG per tablet 1 tablet     1 tablet Oral Daily at bedtime 12/18/19 1118     12/18/19 1400  ceFAZolin (ANCEF) IVPB 1 g/50 mL premix     1 g 100 mL/hr over 30 Minutes Intravenous Every 6 hours 12/18/19 1118 12/18/19 2026   12/18/19 0600  ceFAZolin (ANCEF) IVPB 2g/100 mL premix     2 g 200 mL/hr over 30 Minutes Intravenous On call to O.R. 12/18/19 [id number] 12/18/19 0734   12/18/19 0537  ceFAZolin (ANCEF) 2-4 GM/100ML-% IVPB    Note to Pharmacy: Montel Clock   : cabinet override      12/18/19 0537 12/18/19 0730       Patient was given sequential compression devices, early ambulation, and chemoprophylaxis to prevent DVT.  Patient benefited maximally from hospital stay and there were no complications.    Recent vital signs:  Patient Vitals for the past  24 hrs:  BP Temp Temp src Pulse Resp SpO2  12/19/19 0923 (!) 134/92 98.8 F (37.1 C) Oral 99 16 95 %  12/19/19 0615 (!) 143/99 98.6 F (37 C) Oral 95 14 97 %  12/19/19 0123 (!) 155/109 98.2 F (36.8 C) Oral 95 18 100 %  12/18/19 2158 (!) 157/111 97.7 F (36.5 C) Oral 93 18 99 %  12/18/19 1416 (!) 135/97 97.9 F (36.6 C) Oral 88 16 99 %  12/18/19 1335 (!) 161/95 -- -- 84 16 100 %  12/18/19 1228 (!) 152/93 97.8 F (36.6 C) Oral 77 20 98 %  12/18/19 1119 (!) 144/107 (!) 97.5 F (36.4 C) Oral 78 18 100 %  12/18/19 1045 (!) 153/89 -- -- 73 14 95 %  12/18/19 1030 140/85 -- -- 77 12 95 %  12/18/19 1015 (!) 184/106 -- -- (!) 102 10 98 %     Recent laboratory studies:  Recent Labs    12/19/19 0215  WBC 12.9*  HGB 12.3*  HCT 38.5*  PLT 315  NA 135  K 4.3  CL 98  CO2 26  BUN 20  CREATININE 1.09  GLUCOSE 142*  CALCIUM 9.1     Discharge Medications:   Allergies as of 12/19/2019      Reactions   Lisinopril Other (See Comments), Swelling   Pt reports affected his kidneys; made kidneys start shutting down Renal failure Pt reports affected his kidneys; made kidneys start shutting down Renal failure   Other Rash, Swelling  Swollen Face-eyes,lips   Shellfish-derived Products Rash, Swelling   Swollen Face-eyes,lips      Medication List    TAKE these medications   amLODipine 10 MG tablet Commonly known as: NORVASC Take 10 mg by mouth at bedtime.   aspirin 81 MG chewable tablet Chew 1 tablet (81 mg total) by mouth 2 (two) times daily.   Biktarvy 50-200-25 MG Tabs tablet Generic drug: bictegravir-emtricitabine-tenofovir AF Take 1 tablet by mouth at bedtime.   diphenhydrAMINE 25 mg capsule Commonly known as: BENADRYL Take 25 mg by mouth every 4 (four) hours as needed for allergies or sleep.   diphenhydramine-acetaminophen 25-500 MG Tabs tablet Commonly known as: TYLENOL PM Take 1 tablet by mouth at bedtime as needed (sleep).   esomeprazole 40 MG capsule Commonly  known as: NEXIUM Take 40 mg by mouth at bedtime.   gabapentin 300 MG capsule Commonly known as: NEURONTIN Take 600 mg by mouth every evening.   oxyCODONE 5 MG immediate release tablet Commonly known as: Oxy IR/ROXICODONE Take 1-2 tablets (5-10 mg total) by mouth every 4 (four) hours as needed for moderate pain (pain score 4-6).   tiZANidine 4 MG tablet Commonly known as: ZANAFLEX Take 1 tablet (4 mg total) by mouth every 8 (eight) hours as needed for muscle spasms. What changed:   when to take this  reasons to take this            Durable Medical Equipment  (From admission, onward)         Start     Ordered   12/18/19 1119  DME 3 n 1  Once     12/18/19 1118   12/18/19 1119  DME Walker rolling  Once    Question Answer Comment  Walker: With 5 Inch Wheels   Patient needs a walker to treat with the following condition Status post total replacement of left hip      12/18/19 1118          Diagnostic Studies: DG Pelvis Portable  Result Date: 12/18/2019 CLINICAL DATA:  Total hip replacement on the left EXAM: OPERATIVE LEFT HIP (WITH PELVIS IF PERFORMED) 2 VIEWS TECHNIQUE: Fluoroscopic spot image(s) were submitted for interpretation post-operatively. COMPARISON:  November 12, 2019 FINDINGS: Initial frontal view shows advanced avascular necrosis involving the left femur with mixed sclerotic and lucent areas in flattening. Marked narrowing of the left hip joint noted. Patient is status post prior total hip replacement on the right. Subsequent frontal views show total hip replacement on the left with prosthetic components well-seated. No fracture or dislocation post hip replacement. IMPRESSION: Initial extensive avascular necrosis on the left and secondary osteoarthritis. Total hip replacement on the left with prosthetic components well-seated on frontal view. No acute fracture or dislocation. Prior total hip replacement on the right with prosthetic components appearing well-seated.  Electronically Signed   By: Bretta Bang III M.D.   On: 12/18/2019 09:16   DG C-Arm 1-60 Min-No Report  Result Date: 12/18/2019 Fluoroscopy was utilized by the requesting physician.  No radiographic interpretation.   DG HIP OPERATIVE UNILAT W OR W/O PELVIS LEFT  Result Date: 12/18/2019 CLINICAL DATA:  Total hip replacement on the left EXAM: OPERATIVE LEFT HIP (WITH PELVIS IF PERFORMED) 2 VIEWS TECHNIQUE: Fluoroscopic spot image(s) were submitted for interpretation post-operatively. COMPARISON:  November 12, 2019 FINDINGS: Initial frontal view shows advanced avascular necrosis involving the left femur with mixed sclerotic and lucent areas in flattening. Marked narrowing of the left hip joint noted. Patient is status  post prior total hip replacement on the right. Subsequent frontal views show total hip replacement on the left with prosthetic components well-seated. No fracture or dislocation post hip replacement. IMPRESSION: Initial extensive avascular necrosis on the left and secondary osteoarthritis. Total hip replacement on the left with prosthetic components well-seated on frontal view. No acute fracture or dislocation. Prior total hip replacement on the right with prosthetic components appearing well-seated. Electronically Signed   By: Lowella Grip III M.D.   On: 12/18/2019 09:16    Disposition: Discharge disposition: 01-Home or [place]    Mcarthur Rossetti, MD Follow up in 2 week(s).   Specialty: Orthopedic Surgery Contact information: [address] [place] [address] [phone]            Signed: Mcarthur Rossetti 12/19/2019, 10:14 AM

## 2019-12-19 NOTE — Progress Notes (Signed)
   12/19/19 1400  PT Visit Information  Last PT Received On 12/19/19  Pt progresses. States he is ready to go home; pt is amb to bathroom on his own, cautioned regarding walker safety. Reviewed HEP and pt states he will practice later. Pt reports he does not need to practice steps, he is familiar with technique and only has one small step; RN informed pt reports he is ready to leave.  Assistance Needed +1  History of Present Illness s/p L DA THA 2* AVN. PMH: R DA THA, bil anterior shoulder reconstruction  Subjective Data  Patient Stated Goal less pain  Precautions  Precautions Fall  Restrictions  Weight Bearing Restrictions No  Pain Assessment  Pain Assessment 0-10  Pain Score 4  Pain Location left hip  Pain Descriptors / Indicators Grimacing;Guarding  Pain Intervention(s) Limited activity within patient's tolerance;Monitored during session;Premedicated before session;Repositioned  Cognition  Arousal/Alertness Awake/alert  Behavior During Therapy WFL for tasks assessed/performed  Overall Cognitive Status Within Functional Limits for tasks assessed  Bed Mobility  Overal bed mobility Needs Assistance  Bed Mobility Supine to Sit;Sit to Supine  Supine to sit Supervision  Sit to supine Min guard  General bed mobility comments incr time, use of rails   Transfers  Overall transfer level Needs assistance  Equipment used Rolling walker (2 wheeled)  Transfers Sit to/from Stand  Sit to Stand Supervision  General transfer comment cues for hand placement  Ambulation/Gait  Ambulation/Gait assistance Min guard  Gait Distance (Feet) 12 Feet (x2)  Assistive device Rolling walker (2 wheeled)  Gait Pattern/deviations Step-to pattern;Step-through pattern  General Gait Details cues for RW distance from self   Total Joint Exercises  Ankle Circles/Pumps Limitations  (reviewed HEP, pt states he will practice later )  PT - End of Session  Equipment Utilized During Treatment Gait belt  Activity  Tolerance Patient tolerated treatment well  Patient left with call bell/phone within reach;in chair;with chair alarm set   PT - Assessment/Plan  PT Plan Current plan remains appropriate  PT Visit Diagnosis Difficulty in walking, not elsewhere classified (R26.2)  PT Frequency (ACUTE ONLY) 7X/week  Follow Up Recommendations Follow surgeon's recommendation for DC plan and follow-up therapies  PT equipment None recommended by PT  AM-PAC PT "6 Clicks" Mobility Outcome Measure (Version 2)  Help needed turning from your back to your side while in a flat bed without using bedrails? 3  Help needed moving from lying on your back to sitting on the side of a flat bed without using bedrails? 3  Help needed moving to and from a bed to a chair (including a wheelchair)? 3  Help needed standing up from a chair using your arms (e.g., wheelchair or bedside chair)? 3  Help needed to walk in hospital room? 3  Help needed climbing 3-5 steps with a railing?  3  6 Click Score 18  Consider Recommendation of Discharge To: Home with [facility]  PT Goal Progression  Progress towards PT goals Progressing toward goals  Acute Rehab PT Goals  PT Goal Formulation With patient  Time For Goal Achievement 12/25/19  Potential to Achieve Goals Good  PT Time Calculation  PT Start Time (ACUTE ONLY) 1444  PT Stop Time (ACUTE ONLY) 1500  PT Time Calculation (min) (ACUTE ONLY) 16 min  PT General Charges  $$ ACUTE PT VISIT 1 Visit  PT Treatments  $Gait Training 8-22 mins

## 2019-12-19 NOTE — Progress Notes (Signed)
Physical Therapy Treatment Patient Details Name: Cody Carey MRN: [id number] DOB: 01/14/1966 Today's Date: 12/19/2019    History of Present Illness s/p L DA THA 2* AVN. PMH: R DA THA, bil anterior shoulder reconstruction    PT Comments    Pt progressing well, incr gait distance today. Will see in pm, pt is hopeful to d/c today   Follow Up Recommendations  Follow surgeon's recommendation for DC plan and follow-up therapies     Equipment Recommendations  None recommended by PT    Recommendations for Other Services       Precautions / Restrictions Precautions Precautions: Fall Restrictions Weight Bearing Restrictions: No    Mobility  Bed Mobility Overal bed mobility: Needs Assistance Bed Mobility: Supine to Sit     Supine to sit: Min guard     General bed mobility comments: incr time, assist for LLE, cues for technique and self assist  Transfers Overall transfer level: Needs assistance Equipment used: Rolling walker (2 wheeled) Transfers: Sit to/from Stand Sit to Stand: Min guard         General transfer comment: cues for hand placement  Ambulation/Gait Ambulation/Gait assistance: Min guard Gait Distance (Feet): 250 Feet Assistive device: Rolling walker (2 wheeled) Gait Pattern/deviations: Step-to pattern;Step-through pattern     General Gait Details: cues for RW distance from self    Stairs             Wheelchair Mobility    Modified Rankin (Stroke Patients Only)       Balance                                            Cognition Arousal/Alertness: Awake/alert Behavior During Therapy: WFL for tasks assessed/performed Overall Cognitive Status: Within Functional Limits for tasks assessed                                        Exercises      General Comments        Pertinent Vitals/Pain Pain Score: 4  Pain Descriptors / Indicators: Grimacing;Guarding Pain Intervention(s): Limited activity within  patient's tolerance;Monitored during session;Premedicated before session;Repositioned    Home Living                      Prior Function            PT Goals (current goals can now be found in the care plan section) Acute Rehab PT Goals Patient Stated Goal: less pain PT Goal Formulation: With patient Time For Goal Achievement: 12/25/19 Potential to Achieve Goals: Good Progress towards PT goals: Progressing toward goals    Frequency    7X/week      PT Plan Current plan remains appropriate    Co-evaluation              AM-PAC PT "6 Clicks" Mobility   Outcome Measure  Help needed turning from your back to your side while in a flat bed without using bedrails?: A Little Help needed moving from lying on your back to sitting on the side of a flat bed without using bedrails?: A Little Help needed moving to and from a bed to a chair (including a wheelchair)?: A Little Help needed standing up from a chair using your arms (e.g., wheelchair or bedside  chair)?: A Little Help needed to walk in hospital room?: A Lot Help needed climbing 3-5 steps with a railing? : A Lot 6 Click Score: 16    End of Session Equipment Utilized During Treatment: Gait belt Activity Tolerance: Patient tolerated treatment well Patient left: with call bell/phone within reach;in chair;with chair alarm set   PT Visit Diagnosis: Difficulty in walking, not elsewhere classified (R26.2)     Time: 1540-0867 PT Time Calculation (min) (ACUTE ONLY): 19 min  Charges:  $Gait Training: 8-22 mins                     Itamar Mcgowan, PT   Acute Rehab Dept [facility]): [phone]   12/19/2019    [facility] 12/19/2019, 10:51 AM

## 2019-12-19 NOTE — Plan of Care (Signed)
  Problem: Clinical Measurements: Goal: Respiratory complications will improve Outcome: Progressing   Problem: Clinical Measurements: Goal: Cardiovascular complication will be avoided Outcome: Progressing   Problem: Skin Integrity: Goal: Risk for impaired skin integrity will decrease Outcome: Progressing   Problem: Pain Managment: Goal: General experience of comfort will improve Outcome: Progressing

## 2019-12-19 NOTE — TOC Initial Note (Signed)
Transition of Care [facility]) - Initial/Assessment Note    Patient Details  Name: Cody Carey MRN: [id number] Date of Birth: 27-Apr-1966  Transition of Care (TOC) CM/SW Contact:    Armanda Heritage, RN Phone Number: 12/19/2019, 10:37 AM  Clinical Narrative:                 Patient set up with HHPT with [facility]. Reports has rolling walker and 3in1 at home.  Expected Discharge Plan: Home w Home Health Services Barriers to Discharge: No Barriers Identified   Patient Goals and CMS Choice Patient states their goals for this hospitalization and ongoing recovery are:: to go home CMS Medicare.gov Compare Post Acute Care list provided to:: Patient Choice offered to / list presented to : Patient  Expected Discharge Plan and Services Expected Discharge Plan: Home w Home Health Services   Discharge Planning Services: CM Consult Post Acute Care Choice: Home Health Living arrangements for the past 2 months: Single Family Home Expected Discharge Date: 12/19/19               DME Arranged: N/A DME Agency: NA       HH Arranged: PT HH Agency: [facility] Date HH Agency Contacted: 12/19/19 Time HH Agency Contacted: 1037 Representative spoke with at [facility] Agency: Laurelyn Sickle  Prior Living Arrangements/Services Living arrangements for the past 2 months: Single Family Home   Patient language and need for interpreter reviewed:: Yes Do you feel safe going back to the place where you live?: Yes      Need for Family Participation in Patient Care: Yes (Comment) Care giver support system in place?: Yes (comment)   Criminal Activity/Legal Involvement Pertinent to Current Situation/Hospitalization: No - Comment as needed  Activities of Daily Living Home Assistive Devices/Equipment: Dan Humphreys (specify type) ADL Screening (condition at time of admission) Patient's cognitive ability adequate to safely complete daily activities?: Yes Is the patient deaf or have difficulty  hearing?: No Does the patient have difficulty seeing, even when wearing glasses/contacts?: No Does the patient have difficulty concentrating, remembering, or making decisions?: No Patient able to express need for assistance with ADLs?: Yes Does the patient have difficulty dressing or bathing?: No Independently performs ADLs?: Yes (appropriate for developmental age) Does the patient have difficulty walking or climbing stairs?: Yes Weakness of Legs: Left Weakness of Arms/Hands: None  Permission Sought/Granted                  Emotional Assessment Appearance:: Appears stated age Attitude/Demeanor/Rapport: Engaged Affect (typically observed): Accepting Orientation: : Oriented to Self, Oriented to Place, Oriented to  Time, Oriented to Situation   Psych Involvement: No (comment)  Admission diagnosis:  Status post total replacement of left hip [Z96.642] Patient Active Problem List   Diagnosis Date Noted  . Status post total replacement of left hip 12/18/2019  . Avascular necrosis of hip, left (HCC) 12/17/2019   PCP:  Loura Pardon, PA Pharmacy:   [facility] [phone] - [place] - [address] AT [facility] OF [address] [place] [address] Phone: [phone] Fax: [phone]     Social Determinants of Health (SDOH) Interventions    Readmission Risk Interventions No flowsheet data found.

## 2019-12-19 NOTE — Progress Notes (Signed)
Pt stable at time of d/c. No questions on dc instructions and education. Pt dressing dry and intact.

## 2019-12-19 NOTE — Progress Notes (Signed)
Subjective: 1 Day Post-Op Procedure(s) (LRB): LEFT TOTAL HIP ARTHROPLASTY ANTERIOR APPROACH (Left) Patient reports pain as moderate.    Objective: Vital signs in last 24 hours: Temp:  [97.5 F (36.4 C)-98.8 F (37.1 C)] 98.8 F (37.1 C) (03/20 0923) Pulse Rate:  [73-102] 99 (03/20 0923) Resp:  [10-20] 16 (03/20 0923) BP: (134-184)/(85-111) 134/92 (03/20 0923) SpO2:  [95 %-100 %] 95 % (03/20 0923)  Intake/Output from previous day: 03/19 0701 - 03/20 0700 In: 4218.6 [P.O.:1240; I.V.:2628.6; IV Piggyback:350] Out: 1300 [Urine:1200; Blood:100] Intake/Output this shift: Total I/O In: 451.2 [P.O.:360; I.V.:91.2] Out: -   Recent Labs    12/19/19 0215  HGB 12.3*   Recent Labs    12/19/19 0215  WBC 12.9*  RBC 4.20*  HCT 38.5*  PLT 315   Recent Labs    12/19/19 0215  NA 135  K 4.3  CL 98  CO2 26  BUN 20  CREATININE 1.09  GLUCOSE 142*  CALCIUM 9.1   No results for input(s): LABPT, INR in the last 72 hours.  Sensation intact distally Intact pulses distally Dorsiflexion/Plantar flexion intact Incision: dressing C/D/I   Assessment/Plan: 1 Day Post-Op Procedure(s) (LRB): LEFT TOTAL HIP ARTHROPLASTY ANTERIOR APPROACH (Left) Up with therapy Discharge home with home health this afternoon.    Patient's anticipated LOS is less than 2 midnights, meeting these requirements: - Younger than 80 - Lives within 1 hour of care - Has a competent adult at home to recover with post-op recover - NO history of  - Chronic pain requiring opiods  - Diabetes  - Coronary Artery Disease  - Heart failure  - Heart attack  - Stroke  - DVT/VTE  - Cardiac arrhythmia  - Respiratory Failure/COPD  - Renal failure  - Anemia  - Advanced Liver disease       Kathryne Hitch 12/19/2019, 10:13 AM

## 2019-12-22 ENCOUNTER — Encounter: Payer: Self-pay | Admitting: *Deleted

## 2019-12-25 ENCOUNTER — Telehealth: Payer: Self-pay | Admitting: Orthopaedic Surgery

## 2019-12-25 ENCOUNTER — Other Ambulatory Visit: Payer: Self-pay | Admitting: Orthopaedic Surgery

## 2019-12-25 MED ORDER — OXYCODONE HCL 5 MG PO TABS: 5.0000 mg | ORAL_TABLET | ORAL | 0 refills | Status: DC | PRN

## 2019-12-25 NOTE — Telephone Encounter (Signed)
Please advise 

## 2019-12-25 NOTE — Telephone Encounter (Signed)
Patient called.   He is requesting a refill on his oxycodone.   Call back: [phone]

## 2020-01-04 ENCOUNTER — Ambulatory Visit (INDEPENDENT_AMBULATORY_CARE_PROVIDER_SITE_OTHER): Payer: 59 | Admitting: Physician Assistant

## 2020-01-04 ENCOUNTER — Other Ambulatory Visit: Payer: Self-pay

## 2020-01-04 ENCOUNTER — Encounter: Payer: Self-pay | Admitting: Physician Assistant

## 2020-01-04 DIAGNOSIS — Z96642 Presence of left artificial hip joint: Secondary | ICD-10-CM

## 2020-01-04 MED ORDER — OXYCODONE HCL 5 MG PO TABS: 5.0000 mg | ORAL_TABLET | ORAL | 0 refills | Status: DC | PRN

## 2020-01-04 NOTE — Progress Notes (Signed)
HPI: Mr. Strutz returns today 2 weeks status post left total hip arthroplasty.  He states overall he is doing well.  He is ambulating with a cane but at times forgets to use it.  He denies any chest pain had some shortness of breath with activity but this is with long-term activity.  He is on aspirin twice daily.  Otherwise he states his hip feels much better than preop.  Physical exam: Left hip surgical incisions well approximated staples.  Positive seroma was aspirated after prep with Betadine and 40 cc of bloody aspirate was obtained patient tolerates well.  Left calf supple nontender.  Dorsiflexion plantarflexion left ankle intact.  Fluid range of motion left hip without significant pain.  Ambulates about the room without a cane with a slight antalgic gait.  Impression: Status post left total hip arthroplasty 12/18/2019  Plan: He will continue his aspirin once daily for another week and then discontinue as he was on no aspirin prior to surgery.  Staples removed Steri-Strips applied.  Is able to shower.  Scar tissue mobilization encouraged.  Follow-up with [place] in 4 weeks sooner if there is any questions concerns.

## 2020-01-11 ENCOUNTER — Ambulatory Visit (INDEPENDENT_AMBULATORY_CARE_PROVIDER_SITE_OTHER): Payer: 59 | Admitting: Physician Assistant

## 2020-01-11 ENCOUNTER — Other Ambulatory Visit: Payer: Self-pay

## 2020-01-11 ENCOUNTER — Encounter: Payer: Self-pay | Admitting: Physician Assistant

## 2020-01-11 DIAGNOSIS — Z96642 Presence of left artificial hip joint: Secondary | ICD-10-CM

## 2020-01-11 MED ORDER — OXYCODONE HCL 5 MG PO TABS: 5.0000 mg | ORAL_TABLET | ORAL | 0 refills | Status: DC | PRN

## 2020-01-11 NOTE — Addendum Note (Signed)
Addended by: Richardean Canal on: 01/11/2020 10:06 AM   Modules accepted: Orders

## 2020-01-11 NOTE — Progress Notes (Signed)
HPI: Mr. Getman returns today follow-up left hip due to seroma.  He is wearing his drain.  He is also wanting refill his pain medication.  He has had no fever chills shortness of breath.  He does have some swelling in both legs particularly in the day.  Physical exam left hip surgical incisions healing well no signs of infection.  Positive seroma.  Calves are supple and nontender bilaterally.  No appreciable swelling in either lower extremity.  Impression: Status post left total hip arthroplasty 12/18/2019  Plan: Left hip is aspirated 40 cc of serous sanguinous fluid pain patient tolerates well.  He will continue to work on range of motion strengthening.  He has compression hose at home that he can use.  He will follow-up with [place] in 3 weeks.  Sooner if there is any questions concerns

## 2020-02-01 ENCOUNTER — Ambulatory Visit: Payer: 59 | Admitting: Physician Assistant

## 2020-02-08 ENCOUNTER — Ambulatory Visit (INDEPENDENT_AMBULATORY_CARE_PROVIDER_SITE_OTHER): Payer: 59 | Admitting: Physician Assistant

## 2020-02-08 ENCOUNTER — Encounter: Payer: Self-pay | Admitting: Physician Assistant

## 2020-02-08 ENCOUNTER — Other Ambulatory Visit: Payer: Self-pay

## 2020-02-08 DIAGNOSIS — Z96642 Presence of left artificial hip joint: Secondary | ICD-10-CM

## 2020-02-08 MED ORDER — HYDROCODONE-ACETAMINOPHEN 5-325 MG PO TABS: 1.0000 | ORAL_TABLET | Freq: Four times a day (QID) | ORAL | 0 refills | Status: DC | PRN

## 2020-02-08 NOTE — Progress Notes (Signed)
  HPI: Mr. Aderhold returns today now 7 weeks 3 days status post left total hip arthroplasty.  He is overall doing well.  He states he has some tightness about the hip is wondering if the hip needs to be aspirated.  He is mostly having pain at night.  He is asking for refill on pain medication.  Otherwise trending towards improvement.  Physical exam: Left hip good range of motion without pain.  Ambulates without any assistive device.  Surgical incisions healing well.  Slight fullness plus minus seroma.  Area is prepped with Betadine and then 5 cc of aspirate is obtained.  Patient tolerates well.  Impression: Status post left total hip arthroplasty 12/18/2019  Plan: We will see him back in 4 weeks to see how he is doing overall.  Scar tissue mobilization..  Given prescription for Norco we discontinued his oxycodone.  Questions were encouraged and answered.

## 2020-02-15 ENCOUNTER — Other Ambulatory Visit: Payer: Self-pay | Admitting: Orthopaedic Surgery

## 2020-02-15 ENCOUNTER — Telehealth: Payer: Self-pay | Admitting: Orthopaedic Surgery

## 2020-02-15 MED ORDER — HYDROCODONE-ACETAMINOPHEN 5-325 MG PO TABS: 1.0000 | ORAL_TABLET | Freq: Four times a day (QID) | ORAL | 0 refills | Status: DC | PRN

## 2020-02-15 NOTE — Telephone Encounter (Signed)
Patient called. He would like a refill on hydrocodone called in to his pharmacy. His call back number is [phone]

## 2020-02-15 NOTE — Telephone Encounter (Signed)
Please advise 

## 2020-02-15 NOTE — Telephone Encounter (Signed)
I will send some in. 

## 2020-02-22 ENCOUNTER — Other Ambulatory Visit: Payer: Self-pay | Admitting: Orthopaedic Surgery

## 2020-02-22 ENCOUNTER — Telehealth: Payer: Self-pay | Admitting: Orthopaedic Surgery

## 2020-02-22 MED ORDER — HYDROCODONE-ACETAMINOPHEN 5-325 MG PO TABS: 1.0000 | ORAL_TABLET | Freq: Four times a day (QID) | ORAL | 0 refills | Status: DC | PRN

## 2020-02-22 NOTE — Telephone Encounter (Signed)
Please advise 

## 2020-02-22 NOTE — Telephone Encounter (Signed)
Patient called needing Rx refilled Vicodin. The number to contact patient is [phone]

## 2020-03-01 ENCOUNTER — Other Ambulatory Visit: Payer: Self-pay | Admitting: Orthopaedic Surgery

## 2020-03-01 ENCOUNTER — Telehealth: Payer: Self-pay | Admitting: Orthopaedic Surgery

## 2020-03-01 MED ORDER — HYDROCODONE-ACETAMINOPHEN 5-325 MG PO TABS: 1.0000 | ORAL_TABLET | Freq: Four times a day (QID) | ORAL | 0 refills | Status: DC | PRN

## 2020-03-01 NOTE — Telephone Encounter (Signed)
Please advise Pain medication

## 2020-03-01 NOTE — Telephone Encounter (Signed)
Pt called wanting to see if he could get a refill on his medication.   [phone]

## 2020-03-07 ENCOUNTER — Telehealth: Payer: Self-pay | Admitting: Orthopaedic Surgery

## 2020-03-07 ENCOUNTER — Other Ambulatory Visit: Payer: Self-pay | Admitting: Physician Assistant

## 2020-03-07 ENCOUNTER — Ambulatory Visit: Payer: 59 | Admitting: Orthopaedic Surgery

## 2020-03-07 MED ORDER — HYDROCODONE-ACETAMINOPHEN 5-325 MG PO TABS: 1.0000 | ORAL_TABLET | Freq: Four times a day (QID) | ORAL | 0 refills | Status: DC | PRN

## 2020-03-07 NOTE — Telephone Encounter (Signed)
Pt called and requested a refill of his pain medication.   [phone]

## 2020-03-07 NOTE — Telephone Encounter (Signed)
Sent in

## 2020-03-14 ENCOUNTER — Other Ambulatory Visit: Payer: Self-pay | Admitting: Physician Assistant

## 2020-03-14 ENCOUNTER — Telehealth: Payer: Self-pay | Admitting: Orthopaedic Surgery

## 2020-03-14 MED ORDER — HYDROCODONE-ACETAMINOPHEN 5-325 MG PO TABS: 1.0000 | ORAL_TABLET | Freq: Four times a day (QID) | ORAL | 0 refills | Status: DC | PRN

## 2020-03-14 NOTE — Telephone Encounter (Signed)
Can you please advise?

## 2020-03-14 NOTE — Telephone Encounter (Signed)
Patient called. He would like a refill on hydrocodone. His call back number is [phone]

## 2020-03-14 NOTE — Telephone Encounter (Signed)
Has been sent in  

## 2020-03-21 ENCOUNTER — Telehealth: Payer: Self-pay | Admitting: Orthopaedic Surgery

## 2020-03-21 MED ORDER — HYDROCODONE-ACETAMINOPHEN 5-325 MG PO TABS: 1.0000 | ORAL_TABLET | Freq: Four times a day (QID) | ORAL | 0 refills | Status: DC | PRN

## 2020-03-21 NOTE — Telephone Encounter (Signed)
Pt called in requesting a refill of his hydrocodone medication.   [phone]

## 2020-03-21 NOTE — Telephone Encounter (Signed)
Please advise 

## 2020-03-23 ENCOUNTER — Ambulatory Visit: Payer: Self-pay

## 2020-03-23 ENCOUNTER — Ambulatory Visit (INDEPENDENT_AMBULATORY_CARE_PROVIDER_SITE_OTHER): Payer: 59 | Admitting: Orthopaedic Surgery

## 2020-03-23 ENCOUNTER — Encounter: Payer: Self-pay | Admitting: Orthopaedic Surgery

## 2020-03-23 ENCOUNTER — Other Ambulatory Visit: Payer: Self-pay

## 2020-03-23 DIAGNOSIS — Z96642 Presence of left artificial hip joint: Secondary | ICD-10-CM | POA: Diagnosis not present

## 2020-03-23 DIAGNOSIS — G8929 Other chronic pain: Secondary | ICD-10-CM

## 2020-03-23 DIAGNOSIS — M25562 Pain in left knee: Secondary | ICD-10-CM | POA: Diagnosis not present

## 2020-03-23 NOTE — Progress Notes (Signed)
Cody Carey returns today follow-up of his left total hip.  He states overall he is hip is slowly improving.  He is mostly complaining of left knee pain he has had no known injury.  Feels knee gives way when it swells.  States ibuprofen is seems to help.  He describes no other mechanical symptoms of the knee.  Knee pain does awaken him.  He is also complaining of left thigh numbness about the incision site.  Also complains of bilateral leg swelling no shortness of breath chest pain.  He has some compression hose he can wear currently not wearing.  Review of systems: See HPI otherwise negative  Physical exam: General well-developed well-nourished male no acute distress.  Mood and affect appropriate.  Lower extremities: Bilaterally calves supple nontender.  No significant edema of either leg today.  Left hip good range of motion without pain.  Surgical incisions healed well no signs of infection.  No wound dehiscence.  No recurrence of seroma.  Left knee with slight hyperextension no abnormal warmth erythema or effusion.  No instability valgus varus stressing.  Nontender along medial lateral joint line.  Tender just inferior to the medial aspect of the patella.  Able do straight leg raise.  Radiographs: Left knee two views: No acute fractures.  All three compartments well preserved.  No bony abnormalities.Slight effusion.   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  Also discussed with him knee brace he defers.  We will have him apply Voltaren gel up to four times daily to the knee.  Follow-up with [place] in 1 month to see what type of progress he has had.  Encouraged compression hose for lower extremity swelling.

## 2020-03-28 ENCOUNTER — Telehealth: Payer: Self-pay | Admitting: Orthopaedic Surgery

## 2020-03-28 MED ORDER — HYDROCODONE-ACETAMINOPHEN 5-325 MG PO TABS: 1.0000 | ORAL_TABLET | Freq: Four times a day (QID) | ORAL | 0 refills | Status: AC | PRN

## 2020-03-28 NOTE — Telephone Encounter (Signed)
LMOM for patient letting him know the message below  

## 2020-03-28 NOTE — Telephone Encounter (Signed)
Patient called.   He needs a refill on his Hydrocodone.   Call back: [phone]

## 2020-03-28 NOTE — Telephone Encounter (Signed)
I did send in some more hydrocodone.  He is now over 3 months out from his hip replacement so we need him to now wean from narcotics.  This is the last hydrocodone I can prescribe.

## 2020-03-28 NOTE — Telephone Encounter (Signed)
Please advise 

## 2020-04-18 ENCOUNTER — Ambulatory Visit: Payer: 59 | Admitting: Orthopaedic Surgery

## 2021-05-10 IMAGING — DX DG PORTABLE PELVIS
1 series · 1 of 1 positions shown · non-contrast
Comparison: November 12, 2019

CLINICAL DATA: Total hip replacement on the left

EXAM:
OPERATIVE LEFT HIP (WITH PELVIS IF PERFORMED) 2 VIEWS
TECHNIQUE: Fluoroscopic spot image(s) were submitted for interpretation
post-operatively.

[pelvis ap]
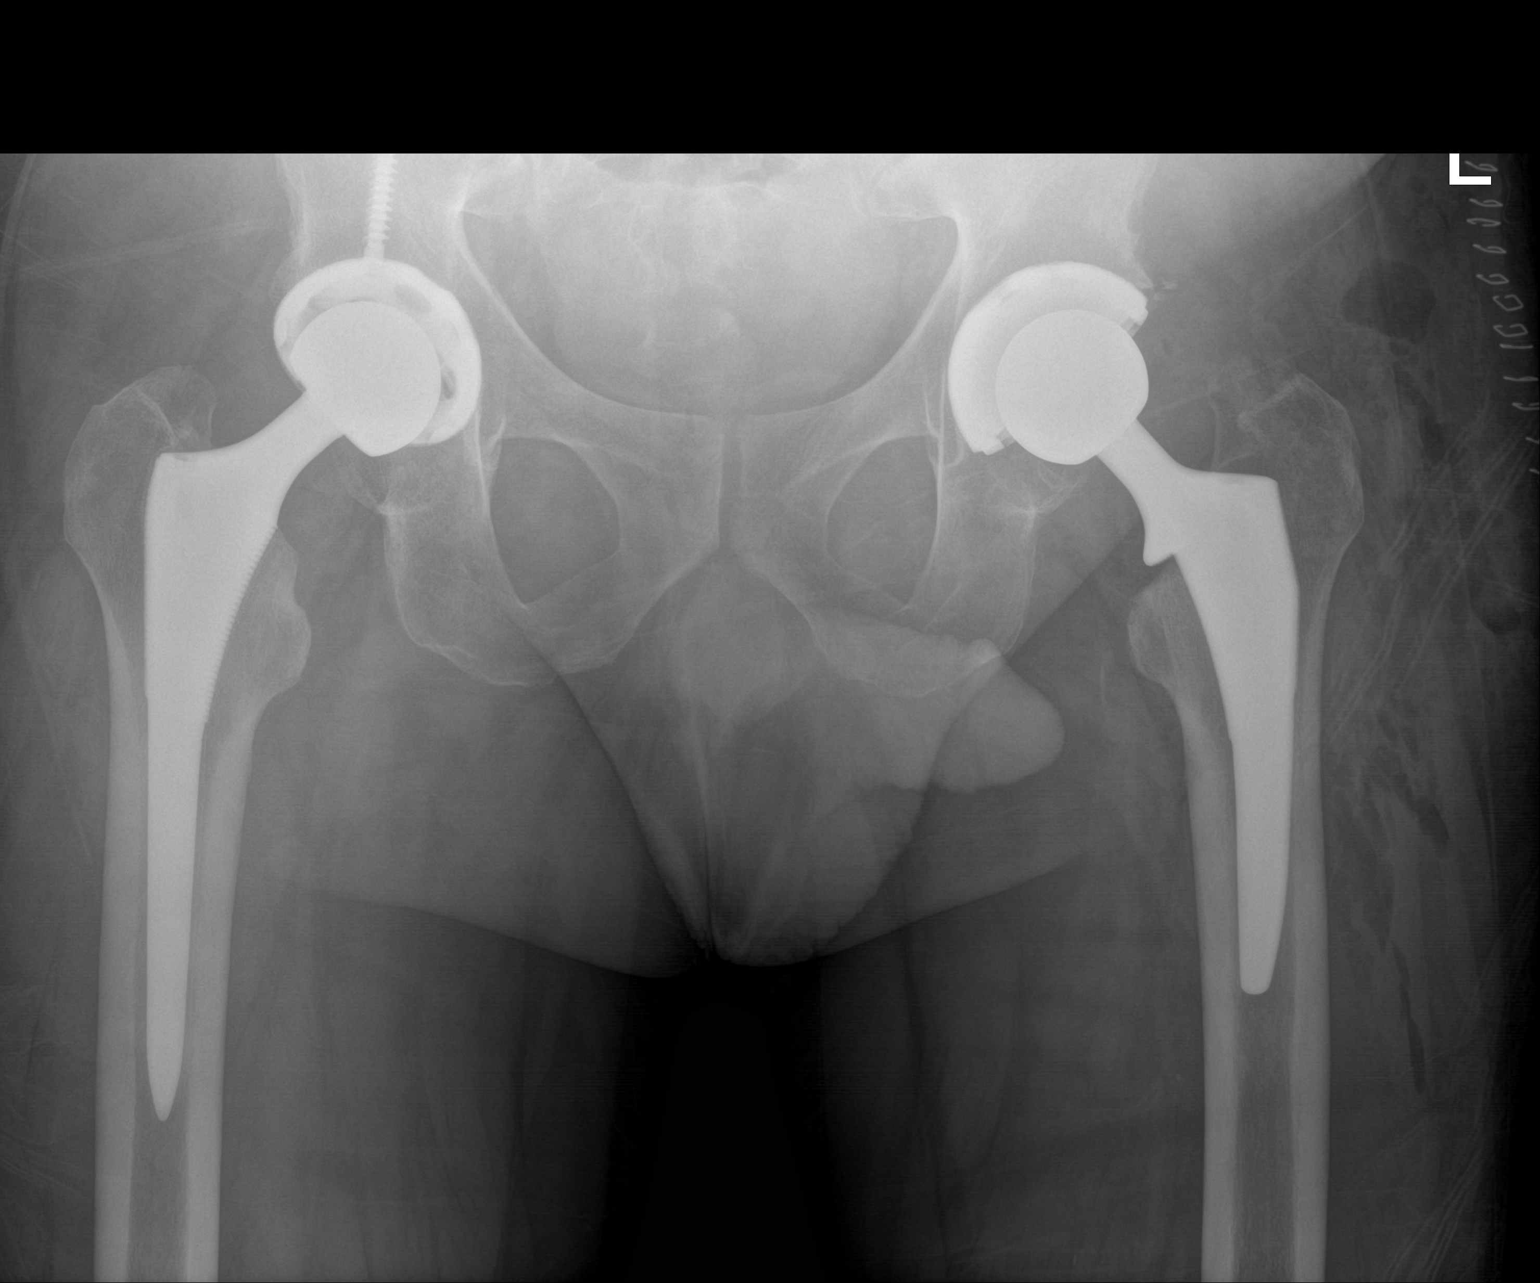

[1 of 1 positions shown; findings below may reference images not displayed]

FINDINGS: Initial frontal view shows advanced avascular necrosis involving the
left femur with mixed sclerotic and lucent areas in flattening.
Marked narrowing of the left hip joint noted. Patient is status post
prior total hip replacement on the right. Subsequent frontal views
show total hip replacement on the left with prosthetic components
well-seated. No fracture or dislocation post hip replacement.
IMPRESSION: Initial extensive avascular necrosis on the left and secondary
osteoarthritis. Total hip replacement on the left with prosthetic
components well-seated on frontal view. No acute fracture or
dislocation. Prior total hip replacement on the right with
prosthetic components appearing well-seated.

## 2021-05-10 IMAGING — RF DG HIP (WITH PELVIS) OPERATIVE*L*
1 series · 5 of 5 positions shown · non-contrast
Comparison: November 12, 2019

CLINICAL DATA: Total hip replacement on the left

EXAM:
OPERATIVE LEFT HIP (WITH PELVIS IF PERFORMED) 2 VIEWS
TECHNIQUE: Fluoroscopic spot image(s) were submitted for interpretation
post-operatively.

[Series 1: unknown protocol · 0.20mm/px · 5 of 5 slices shown]
[im 1/5]
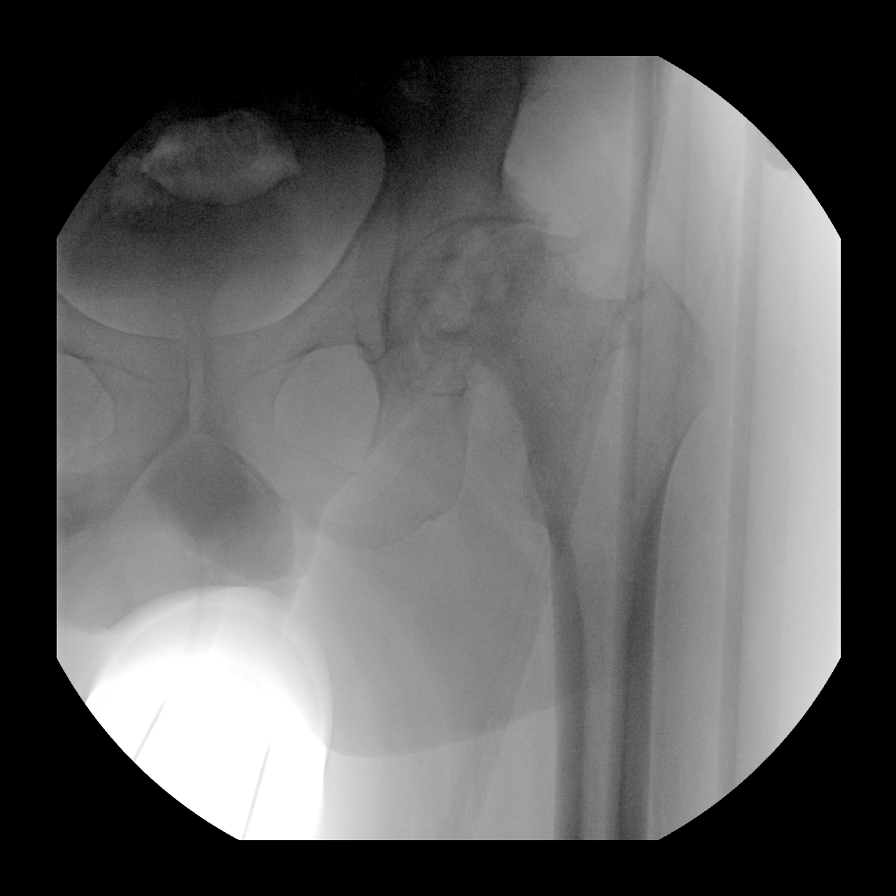
[im 2/5]
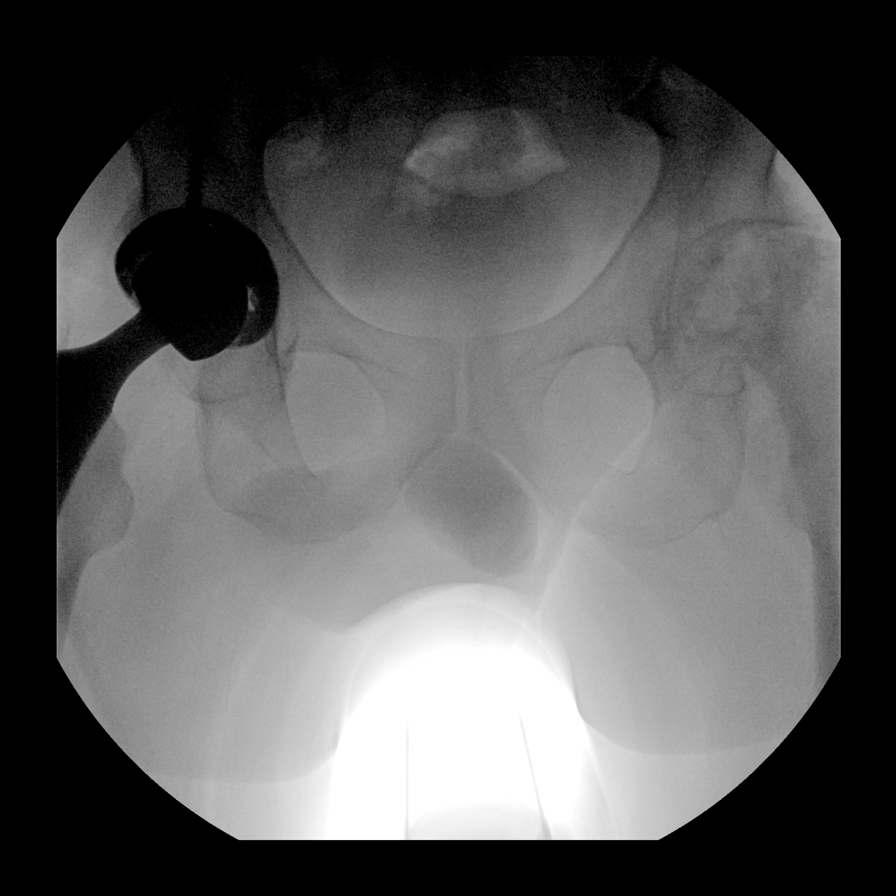
[im 3/5]
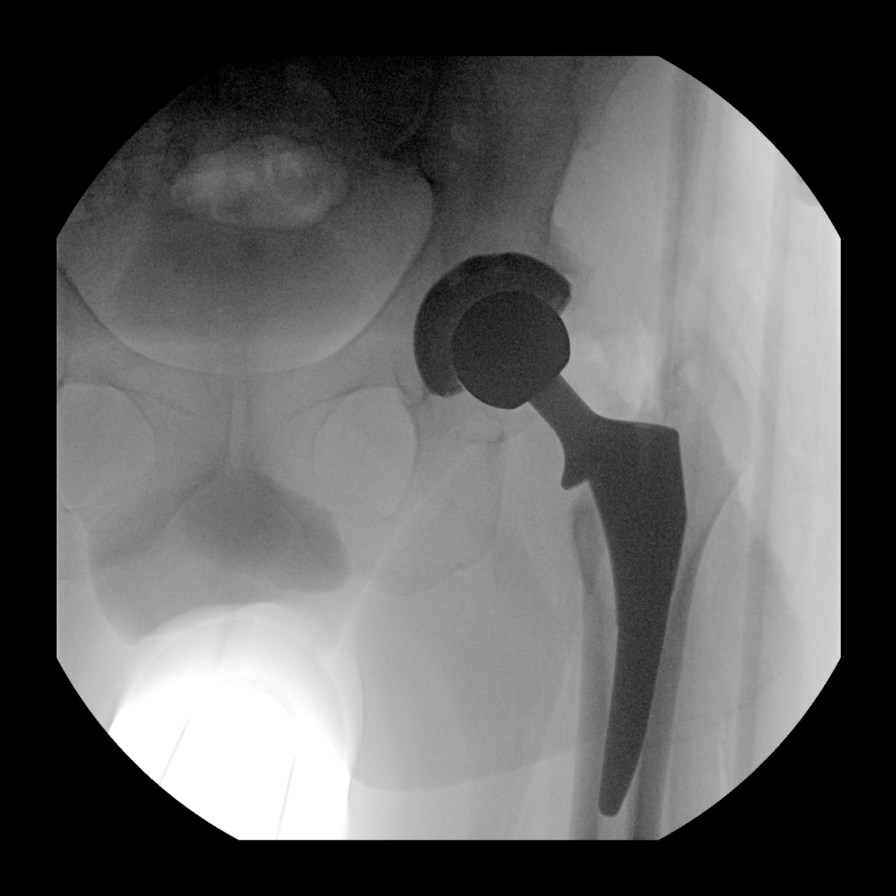
[im 4/5]
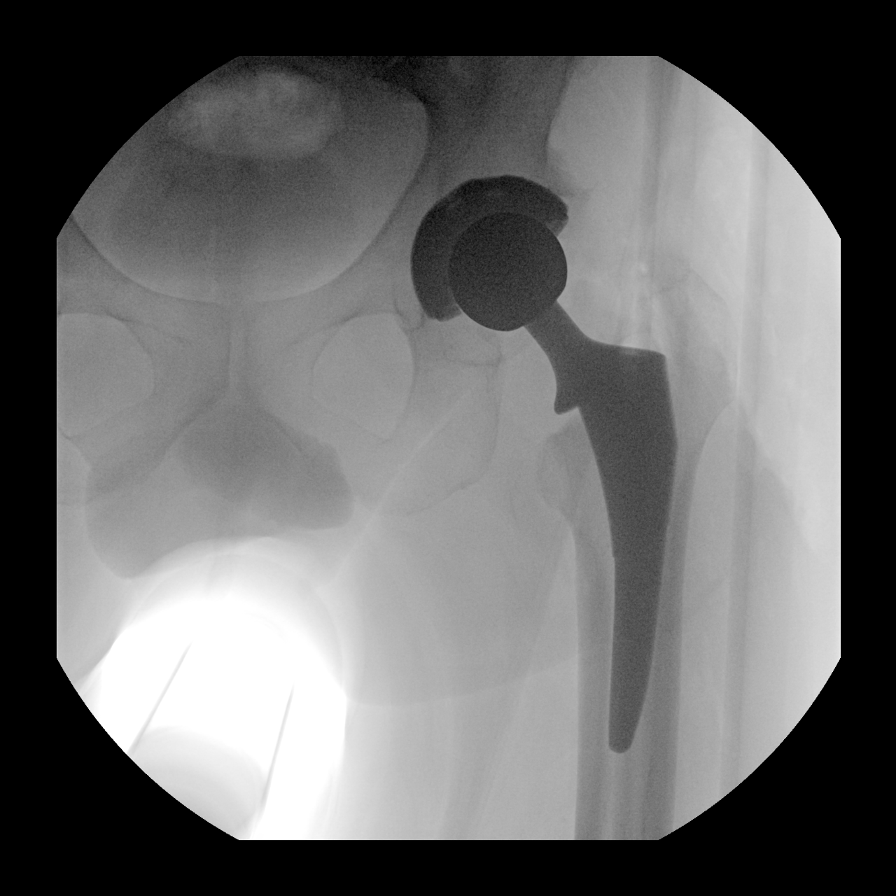
[im 5/5]
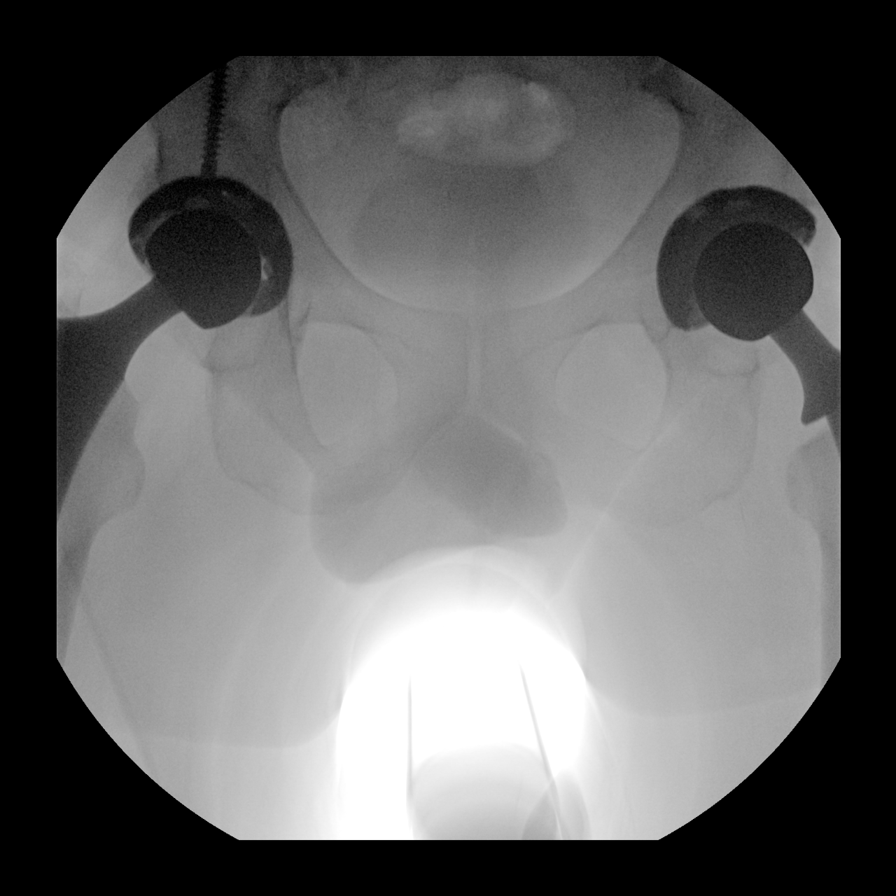

[5 of 5 positions shown; findings below may reference images not displayed]

FINDINGS: Initial frontal view shows advanced avascular necrosis involving the
left femur with mixed sclerotic and lucent areas in flattening.
Marked narrowing of the left hip joint noted. Patient is status post
prior total hip replacement on the right. Subsequent frontal views
show total hip replacement on the left with prosthetic components
well-seated. No fracture or dislocation post hip replacement.
IMPRESSION: Initial extensive avascular necrosis on the left and secondary
osteoarthritis. Total hip replacement on the left with prosthetic
components well-seated on frontal view. No acute fracture or
dislocation. Prior total hip replacement on the right with
prosthetic components appearing well-seated.

## 2021-05-17 ENCOUNTER — Ambulatory Visit: Payer: 59 | Admitting: Orthopaedic Surgery

## 2024-01-06 ENCOUNTER — Ambulatory Visit: Admitting: Orthopaedic Surgery
